# Patient Record
Sex: Male | Born: 1950 | Race: White | Hispanic: No | Marital: Married | Smoking: Never smoker
Health system: Southern US, Community
[De-identification: ages and names within clinical notes are randomized; demographics above are authoritative.]

## PROBLEM LIST (undated history)

## (undated) DIAGNOSIS — M1990 Unspecified osteoarthritis, unspecified site: Secondary | ICD-10-CM

## (undated) DIAGNOSIS — M109 Gout, unspecified: Secondary | ICD-10-CM

## (undated) DIAGNOSIS — Z9884 Bariatric surgery status: Secondary | ICD-10-CM

## (undated) DIAGNOSIS — S82002A Unspecified fracture of left patella, initial encounter for closed fracture: Secondary | ICD-10-CM

## (undated) DIAGNOSIS — S72142A Displaced intertrochanteric fracture of left femur, initial encounter for closed fracture: Secondary | ICD-10-CM

## (undated) HISTORY — PX: KNEE SURGERY: SHX244

---

## 1999-02-07 ENCOUNTER — Encounter: Admission: RE | Admit: 1999-02-07 | Discharge: 1999-05-08 | Payer: Self-pay | Admitting: *Deleted

## 1999-02-24 ENCOUNTER — Ambulatory Visit: Admission: RE | Admit: 1999-02-24 | Discharge: 1999-02-24 | Payer: Self-pay | Admitting: *Deleted

## 1999-02-25 ENCOUNTER — Ambulatory Visit: Admission: RE | Admit: 1999-02-25 | Discharge: 1999-02-25 | Payer: Self-pay | Admitting: *Deleted

## 2002-06-26 ENCOUNTER — Encounter (INDEPENDENT_AMBULATORY_CARE_PROVIDER_SITE_OTHER): Payer: Self-pay

## 2002-06-26 ENCOUNTER — Ambulatory Visit (HOSPITAL_COMMUNITY): Admission: RE | Admit: 2002-06-26 | Discharge: 2002-06-26 | Payer: Self-pay | Admitting: Gastroenterology

## 2002-09-18 ENCOUNTER — Ambulatory Visit (HOSPITAL_BASED_OUTPATIENT_CLINIC_OR_DEPARTMENT_OTHER): Admission: RE | Admit: 2002-09-18 | Discharge: 2002-09-18 | Payer: Self-pay | Admitting: Orthopedic Surgery

## 2003-12-13 ENCOUNTER — Encounter: Admission: RE | Admit: 2003-12-13 | Discharge: 2003-12-13 | Payer: Self-pay | Admitting: Gastroenterology

## 2004-01-27 ENCOUNTER — Encounter (INDEPENDENT_AMBULATORY_CARE_PROVIDER_SITE_OTHER): Payer: Self-pay | Admitting: Specialist

## 2004-01-27 ENCOUNTER — Ambulatory Visit (HOSPITAL_COMMUNITY): Admission: EM | Admit: 2004-01-27 | Discharge: 2004-01-27 | Payer: Self-pay | Admitting: Emergency Medicine

## 2004-03-25 ENCOUNTER — Ambulatory Visit (HOSPITAL_COMMUNITY): Admission: RE | Admit: 2004-03-25 | Discharge: 2004-03-25 | Payer: Self-pay | Admitting: Gastroenterology

## 2004-03-25 ENCOUNTER — Encounter (INDEPENDENT_AMBULATORY_CARE_PROVIDER_SITE_OTHER): Payer: Self-pay | Admitting: *Deleted

## 2004-05-20 ENCOUNTER — Ambulatory Visit (HOSPITAL_BASED_OUTPATIENT_CLINIC_OR_DEPARTMENT_OTHER): Admission: RE | Admit: 2004-05-20 | Discharge: 2004-05-20 | Payer: Self-pay | Admitting: Orthopedic Surgery

## 2004-05-20 ENCOUNTER — Ambulatory Visit (HOSPITAL_COMMUNITY): Admission: RE | Admit: 2004-05-20 | Discharge: 2004-05-20 | Payer: Self-pay | Admitting: Orthopedic Surgery

## 2005-10-09 ENCOUNTER — Encounter: Admission: RE | Admit: 2005-10-09 | Discharge: 2005-10-09 | Payer: Self-pay | Admitting: Surgery

## 2005-10-21 ENCOUNTER — Ambulatory Visit (HOSPITAL_COMMUNITY): Admission: RE | Admit: 2005-10-21 | Discharge: 2005-10-21 | Payer: Self-pay | Admitting: Surgery

## 2005-10-26 ENCOUNTER — Ambulatory Visit (HOSPITAL_COMMUNITY): Admission: RE | Admit: 2005-10-26 | Discharge: 2005-10-26 | Payer: Self-pay | Admitting: Surgery

## 2006-01-04 ENCOUNTER — Encounter: Admission: RE | Admit: 2006-01-04 | Discharge: 2006-01-12 | Payer: Self-pay | Admitting: Surgery

## 2006-01-11 ENCOUNTER — Ambulatory Visit (HOSPITAL_COMMUNITY): Admission: RE | Admit: 2006-01-11 | Discharge: 2006-01-11 | Payer: Self-pay | Admitting: Gastroenterology

## 2006-01-18 ENCOUNTER — Ambulatory Visit (HOSPITAL_COMMUNITY): Admission: RE | Admit: 2006-01-18 | Discharge: 2006-01-19 | Payer: Self-pay | Admitting: Surgery

## 2006-02-05 ENCOUNTER — Encounter: Admission: RE | Admit: 2006-02-05 | Discharge: 2006-02-05 | Payer: Self-pay | Admitting: Rheumatology

## 2006-02-17 ENCOUNTER — Ambulatory Visit (HOSPITAL_COMMUNITY): Admission: RE | Admit: 2006-02-17 | Discharge: 2006-02-17 | Payer: Self-pay | Admitting: Gastroenterology

## 2006-02-17 ENCOUNTER — Encounter (INDEPENDENT_AMBULATORY_CARE_PROVIDER_SITE_OTHER): Payer: Self-pay | Admitting: Specialist

## 2006-06-29 HISTORY — PX: ROUX-EN-Y PROCEDURE: SUR1287

## 2006-08-30 ENCOUNTER — Ambulatory Visit (HOSPITAL_COMMUNITY): Admission: RE | Admit: 2006-08-30 | Discharge: 2006-08-30 | Payer: Self-pay | Admitting: Gastroenterology

## 2007-07-23 ENCOUNTER — Observation Stay (HOSPITAL_COMMUNITY): Admission: EM | Admit: 2007-07-23 | Discharge: 2007-07-24 | Payer: Self-pay | Admitting: Emergency Medicine

## 2007-07-24 ENCOUNTER — Encounter (INDEPENDENT_AMBULATORY_CARE_PROVIDER_SITE_OTHER): Payer: Self-pay | Admitting: Internal Medicine

## 2007-08-01 ENCOUNTER — Encounter: Admission: RE | Admit: 2007-08-01 | Discharge: 2007-08-01 | Payer: Self-pay | Admitting: Family Medicine

## 2007-08-05 ENCOUNTER — Ambulatory Visit (HOSPITAL_COMMUNITY): Admission: RE | Admit: 2007-08-05 | Discharge: 2007-08-05 | Payer: Self-pay | Admitting: Family Medicine

## 2007-08-05 ENCOUNTER — Encounter (INDEPENDENT_AMBULATORY_CARE_PROVIDER_SITE_OTHER): Payer: Self-pay | Admitting: Family Medicine

## 2009-07-18 ENCOUNTER — Encounter: Admission: RE | Admit: 2009-07-18 | Discharge: 2009-07-18 | Payer: Self-pay | Admitting: Rheumatology

## 2009-07-19 ENCOUNTER — Encounter: Admission: RE | Admit: 2009-07-19 | Discharge: 2009-07-19 | Payer: Self-pay | Admitting: Rheumatology

## 2009-07-30 ENCOUNTER — Encounter: Admission: RE | Admit: 2009-07-30 | Discharge: 2009-07-30 | Payer: Self-pay | Admitting: Rheumatology

## 2009-08-22 ENCOUNTER — Encounter: Admission: RE | Admit: 2009-08-22 | Discharge: 2009-08-22 | Payer: Self-pay | Admitting: Rheumatology

## 2010-07-20 ENCOUNTER — Encounter: Payer: Self-pay | Admitting: Gastroenterology

## 2010-07-20 ENCOUNTER — Encounter: Payer: Self-pay | Admitting: Family Medicine

## 2010-11-11 NOTE — Discharge Summary (Signed)
[name], [name]                ACCOUNT NO.:  [id number]   MEDICAL RECORD NO.:  [id number]          PATIENT TYPE:  INP   LOCATION:  4703                         FACILITY:  [facility]   PHYSICIAN:  Corinna L. Lendell Caprice, MDDATE OF BIRTH:  [birth year]   DATE OF ADMISSION:  07/23/2007  DATE OF DISCHARGE:  07/24/2007                               DISCHARGE SUMMARY   DISCHARGE DIAGNOSES:  1. Transient ischemic attack.  2. Abnormal pituitary gland on MRI, needs non-emergent MRI of the      pituitary gland with and without contrast, to evaluate further.  3. Right carotid bruit, needs outpatient carotid Doppler ultrasound.  4. Hyperlipidemia, currently controlled on therapy.  5. Obesity.  6. Obstructive sleep apnea.  7. Arthritis.  8. Gastroesophageal reflux disease.  9. Gout.   DISCHARGE MEDICATIONS:  Aspirin 325 mg a day.  1. He may continue his outpatient medications which include      propoxyphene/APAP, one tablet two to three times a day as needed      for pain.  2. Simvastatin 20 mg a day.  3. Glimepiride/metformin 5/500 mg a day.  4. Colchicine 0.6 mg a day.  5. Cartia XT 120 mg a day.  6. Nexium 40 mg a day.  7. Diclofenac 75 mg a day.   DIET:  Should be diabetic low salt.  He is encouraged activity ad lib.  He is encouraged to increase his aerobic activity and lose weight,  follow up with Dr. Tiburcio Pea, first available appointment.  He will need an  MRI of the pituitary gland with and without contrast and apparently  prefers open MRI.  He will also need an echocardiogram and carotid  Dopplers for workup of the TIA.  Also at this time, his hemoglobin A1c  homocystine level and official reading of the MRI and MRA of the brain  are pending.  Condition stable.   CONSULTATIONS:  None.   PROCEDURES:  None.   PERTINENT LABORATORY DATA:  CBC unremarkable.  Basic metabolic panel  significant for a glucose of 162, total cholesterol is 127,  triglycerides 172, HDL 25, LDL 68.   SPECIAL STUDIES/RADIOLOGY:  EKG shows normal sinus rhythm.  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  The MRA of the brain  was done but has no preliminary reading yet.  CT of the brain without  contrast shows a acute sinusitis with pansinusitis involvement.   HISTORY AND HOSPITAL COURSE:  [name] is a pleasant 60 year old white  male who went to the walk-in clinic after having woken up with left-  sided numbness of his face, left thumb and finger and foot.  He  reportedly had some left-sided weakness and pronator drift in the  clinic.  He also complained of having difficulty drooling when he would  drink liquids.  He was sent to the  emergency room.  He had woken up with  these symptoms.  By the time he was evaluated by me, his only residual  was slightly decreased sensation over the lower jaw area, but otherwise  was neurologically intact.  Please see H&P for complete admission  details.  The patient has no history of stroke.  He was placed on 23-  hour observation on telemetry, where he remained in normal sinus rhythm.  He was started on an aspirin a day.  He had not been on aspirin  previously.  It was a struggle to be even have him consent to be placed  on observation overnight.  As today is Sunday,  doppler of the carotids  and echocardiogram cannot be done.  He refuses to stay for an extra day  so that these can be done on Monday.  He apparently had difficulty in  the MRI scanner due to size and claustrophobia, and the MRI of the  pituitary gland was recommended on a non-emergent basis.  Also, he has  several tests pending as listed above.  His symptoms have completely  resolved  and I feel that he is safe to go home, but I have instructed  him to call 9-1-1 immediately for any recurrence of his symptoms.  As it  is a Sunday, it is difficult to schedule all these follow-up tests as an  outpatient, and I have will therefore asked that his primary care  physician, Dr. Leonides Sake, please do so.  I will leave a message on  Dr. Johnathan Hausen voice mail.  The patient is obese, and I also recommended  portion control and increased aerobic activity.      Corinna L. Lendell Caprice, MD  Electronically Signed     CLS/MEDQ  D:  07/24/2007  T:  07/24/2007  Job:  [id number]   cc:   Holley Bouche, M.D.

## 2010-11-11 NOTE — H&P (Signed)
NAME:  [name], [name]                ACCOUNT NO.:  [id number]   MEDICAL RECORD NO.:  [id number]          PATIENT TYPE:  EMS   LOCATION:  MAJO                         FACILITY:  [facility]   PHYSICIAN:  Corinna L. Lendell Caprice, MDDATE OF BIRTH:  1950/10/31   DATE OF ADMISSION:  07/23/2007  DATE OF DISCHARGE:                              HISTORY & PHYSICAL   CHIEF COMPLAINT:  Tingling.   HISTORY OF PRESENT ILLNESS:  [name] is a 60 year old white male with  multiple medical problems who presents to the emergency room after  having woken up with tingling on the left side of his face.  He also  noted tingling in his left thumb and forefinger.  He also was dribbling  when he was drinking and had to use a straw.  He had some tingling in  his left foot initially, also the left side of his face was numb.  Currently, he has some numbness at the lower left side of his face but  otherwise his symptoms have resolved.  He presented to the [facility] initially and reportedly had some left-sided weakness and  pronator drift.  The patient has no history of stroke.  He is a diabetic  and has hyperlipidemia.  He is obese.   PAST MEDICAL HISTORY:  1. Hyperlipidemia.  2. Diabetes.  3. Obesity.  4. Obstructive sleep apnea.  5. Arthritis.  6. Gastroesophageal reflux disease.   MEDICATIONS:  Metformin, dose unknown.  Nexium daily.  He takes  colchicine one tablet a day and an arthritis medicine, also another  medicine for his esophagus and a cholesterol medicine.  He does not take  aspirin.   FAMILY HISTORY:  His mother had a stroke and died in her late 69s.  His  sister had a stroke during pregnancy.   REVIEW OF SYSTEMS:  Reviewed and as above, otherwise negative.   PHYSICAL EXAMINATION:  Please see nursing notes for vital signs.  GENERAL:  The patient is an obese white male in no acute distress.  HEENT:  Normocephalic, atraumatic.  Pupils equal, round, reactive to  light.  Sclerae  nonicteric.  Moist mucous membranes.  Oropharynx is  without erythema or exudate.  His face is symmetric.  Tongue is midline,  gag reflex intact.  NECK:  Supple.  He has a right-sided carotid bruit.  No lymphadenopathy.  No JVD.  No thyromegaly.  LUNGS:  Clear to auscultation bilaterally without wheezes, rhonchi or  rales.  CARDIOVASCULAR:  Regular rate and rhythm without murmurs, gallops or  rubs.  ABDOMEN:  Obese, soft, nontender.  GU AND RECTAL:  Deferred.  EXTREMITIES:  He has trace edema.  Pulses are intact.  SKIN:  No rash.  PSYCHIATRIC:  The patient has normal affect.  NEUROLOGIC:  Cranial nerve exam is significant for slight diminished  sensation on the left jaw area, otherwise unremarkable.  Motor strength  5/5 in the arms and legs.  Pinprick sensation intact.  Deep tendon  reflexes diminished throughout.  Gait normal.  Romberg negative.  No  pronator drift.  Finger-to-nose normal.  Speech is fluent.  Heel-to-shin  normal.  Babinski's negative.   LABORATORIES:  CBC unremarkable.  Basic metabolic panel significant for  a glucose of 162.  EKG:  Normal sinus rhythm.  CT brain shows no infarct  or bleed.  He has pan sinusitis.   ASSESSMENT AND PLAN:  1. Stroke versus transient ischemic attack:  The patient will be      placed on 23-hour observation.  I will start aspirin daily.  Check      fasting lipids, homocysteine, MRI of brain, MRA of brain and neck,      order carotid Dopplers and echocardiogram, homocysteine, neuro      checks.  He will be placed on telemetry.  2. Diabetes:  He reports taking two tablets of metformin twice a day.      I will give 1000 mg twice a day and monitor blood glucose.  Also      check a hemoglobin A1c.  3. Gastroesophageal reflux disease:  Continue proton pump inhibitor.  4. Obstructive sleep apnea:  Continue CPAP.  5. History of gout:  I failed to mention this above, but I will      continue his colchicine daily.  6. Arthritis.  7.  Obesity.  8. Hyperlipidemia:  See above.      Corinna L. Lendell Caprice, MD  Electronically Signed     CLS/MEDQ  D:  07/23/2007  T:  07/24/2007  Job:  [id number]   cc:   [name], M.D.

## 2010-11-14 NOTE — Op Note (Signed)
[name], [name]                            ACCOUNT NO.:  [id number]   MEDICAL RECORD NO.:  [id number]                   PATIENT TYPE:  AMB   LOCATION:  DSC                                  FACILITY:  [facility]   PHYSICIAN:  Robert A. Thurston Hole, M.D.              DATE OF BIRTH:  [birth year]   DATE OF PROCEDURE:  09/18/2002  DATE OF DISCHARGE:                                 OPERATIVE REPORT   PREOPERATIVE DIAGNOSIS:  Right carpal tunnel syndrome.   POSTOPERATIVE DIAGNOSIS:  Right carpal tunnel syndrome.   PROCEDURE:  Right carpal tunnel release.   SURGEON:  Elana Alm. Thurston Hole, M.D.   ASSISTANT:  Julien Girt, P.A.   ANESTHESIA:  IV regional/general.   OPERATIVE TIME:  20 minutes.   COMPLICATIONS:  None.   INDICATIONS FOR PROCEDURE:  The patient is a 60 year old gentleman who has  had significant painful right carpal tunnel syndrome documented by EMG and  PNC  re-evaluations, and unresponsive to the conservative care.  He is now  to undergo a right carpal tunnel release.   DESCRIPTION OF PROCEDURE:  The patient is brought to the operating room on  September 18, 2002, and placed on the operating room table in the supine  position.  After an adequate level of IV regional anesthesia was obtained,  his right hand and arm were prepped using sterile DuraPrep and draped using  a sterile technique.  On initial skin incision, it was noted that there was  not adequate regional anesthesia, and thus he was converted to general  anesthesia without complications.  Initially through a 4.0 cm palmar  incision, initial exposure was made.  The underlying subcutaneous tissues  were incised in line with the skin incision.  The transverse carpal ligament  was exposed at the level of the reverse flexion crease and incised.  A  hemostat was used to protect the median nerve, and the entire transverse  carpal ligament was released distally to the level of the superficial palmar  arch, carefully  protecting this, and released proximally, approximately  three inches proximal to the wrist flexion crease, carefully protecting the  palmar cutaneous branch of the median nerve.  The median nerve itself was  found to be significantly flattened and compressed, but no other pathology  was noted.  The wound was then irrigated and closed using interrupted #3-0  nylon mattress sutures, and injected with 0.25% Marcaine.  Sterile dressings  were applied.  The tourniquet was released.  The patient was then awakened  and extubated and taken to the recovery room in stable condition.    FOLLOW UP:  The patient will be followed as an outpatient on Vicodin and  Aleve.  I will see him back in the office in one week for a wound check and  followup.  Robert A. Thurston Hole, M.D.    RAW/MEDQ  D:  09/18/2002  T:  09/18/2002  Job:  [id number]

## 2010-11-14 NOTE — Op Note (Signed)
NAMEFABYAN, LOUGHMILLER                ACCOUNT NO.:  [id number]   MEDICAL RECORD NO.:  [id number]          PATIENT TYPE:  AMB   LOCATION:  ENDO                         FACILITY:  [facility]   PHYSICIAN:  Petra Kuba, M.D.    DATE OF BIRTH:  04/01/1951   DATE OF PROCEDURE:  08/30/2006  DATE OF DISCHARGE:                               OPERATIVE REPORT   PROCEDURE:  EGD with Botox injection.   INDICATION:  Dysphasia appears to be achalasia although not confirmed by  manometry, was helped by balloon dilatation in the past.  Consent was  signed after risks, benefits, methods and options were thoroughly  discussed multiple times in the past before any sedation today.   MEDICINES USED:  Fentanyl 75 mcg, Versed 6 mg.   PROCEDURE:  The video endoscope was inserted by direct vision.  The  esophagus was normal.  At the GE junction was the achalasia-like bird's  beak appearance and with minimal gentle pressure we could advance into  the stomach.  On slow withdrawal he did have a small hiatal hernia, but  no obvious stricture or ring.  We did withdraw back to 20-cm which  confirmed the esophageal above-mentioned normal appearance except for  the spasm at the GE junction as mentioned above.  The scope was  reinserted to the antrum where some mild to moderate antral erosions,  probably nonsteroidal-induced were seen.  Advancement showed normal  pylorus into a duodenal bulb where it confirmed mild amount of bulbitis  then around the celiac to a normal second portion of the duodenum.  The  scope was withdrawn back to the bulb which confirmed the above  appearance.  The scope was withdrawn back to the antrum and again the  erosions were confirmed.  The scope was then retroflexed.  High in the  cardia the hiatal hernia was confirmed.  The fundus, angularis, lesser  and greater curve were normal in retroflexed visualization.  Straight  visualization in the stomach did not reveal any additional findings.   We  went ahead and slowly withdrew back to 20-cm, confirming above findings.  We then went ahead and injected Botox into the 4 quadrants at the GE  junction, 1 mL per injection for a total of 4 mL in a 100 units of Botox  in the customary fashion.  There were no signs of bleeding or obvious  complication.  The scope was reinserted into the stomach, air was  suctioned and scope removed.  The patient tolerated the procedure well.  There was no obvious immediate complications.   ENDOSCOPIC DIAGNOSES:  1. Small hiatal hernia with achalasia-like area just above it at the      gastroesophageal junction, status post Botox 100 units in 4      injections in the customary fashion.  2. Multiple antral erosions, mild to moderate.  3. Minimal bulbitis.  4. Otherwise normal esophagogastroduodenoscopy.   PLAN:  See how the Botox works.  Call me p.r.n.  Otherwise, follow up in  6 weeks to recheck symptoms and decide further workup and plans.  In the  meantime,  will add a pump inhibitor over-the-counter Prilosec to protect  the stomach from further aspirin and nonsteroidal damage.           ______________________________  Petra Kuba, M.D.     MEM/MEDQ  D:  08/30/2006  T:  08/30/2006  Job:  [id number]   cc:   Holley Bouche, M.D.  [place] Daphine Deutscher, MD

## 2010-11-14 NOTE — Op Note (Signed)
[name], [name]                ACCOUNT NO.:  [id number]   MEDICAL RECORD NO.:  [id number]          PATIENT TYPE:  AMB   LOCATION:  ENDO                         FACILITY:  [facility]   PHYSICIAN:  Petra Kuba, M.D.    DATE OF BIRTH:  [birth year]   DATE OF PROCEDURE:  03/25/2004  DATE OF DISCHARGE:                                 OPERATIVE REPORT   PROCEDURE:  Esophagogastroduodenoscopy with biopsy and Savory dilatation.   INDICATIONS:  Dysphagia and want to recheck his esophageal ulcer.   Consent was signed after risks, benefits, methods, and options thoroughly  discussed in the office.   MEDICINES USED:  Demerol 40, Versed 4.   PROCEDURE NOTE:  A video endoscope was inserted by direct vision. The  proximal and mid esophagus were normal, and distal esophagus was a very  small swallow linear ulceration. He had a large hiatal hernia. There seemed  to be a widely patent fibrous ring, although there was a lot of distal  esophageal spasm. Scope passed easily into the moderately large hiatal  hernia pouch, into the stomach, advanced to the antrum where some antral  erosions were seen, and through a normal pylorus into a normal duodenal bulb  _____________ to a  normal second portion of the duodenum. Scope was  withdrawn back to the bulb, and a good look there ruled out ulcers in that  location. Scope was withdrawn back to the stomach and retroflexed.  Angularis, cardiac, and fundus were pertinent for the hiatal hernia being  confirmed. No other abnormalities. Lesser and greater curve were normal.  Straight visualization of the stomach did not reveal any additional  findings. Scope was slowly withdrawn back to about 20 cm which confirmed the  above esophageal findings. Scope was again slowly advanced to the antrum.  Few biopsies of the antrum and few of the proximal stomach were obtained to  rule out helicobacter pylori. Scope was reinserted to the antrum. Savory  wire was advanced and  confirmed in the proper position under fluoro. Scope  was removed, making sure to keep the wire in proper position, and in  succession, the Savory 14, 16, and 17 mm dilators were all advanced into the  stomach and turned in the proper position under fluoro. There was no heme or  no resistance on any of the dilators. Once the 17 was confirmed in the  stomach, the wire was withdrawn back into the scope. Both were removed in  tandem. The procedure was terminated. The patient tolerated the procedure  well. There was no obvious immediate complication.   ENDOSCOPIC DIAGNOSES:  1.  Large hiatal hernia with moderately wide patent ring but there was some      spasms.  2.  Swallow linear esophageal ulcer, not worrisome.  3.  Multiple antral erosions status post biopsy.  4.  Otherwise normal esophagogastroduodenoscopy therapy. Savory dilatation      under fluoro to 17 mm without heme or resistance.   PLAN:  See if the dilation works. Consider adding Zelnorm or Reglan. Might  want to repeat his barium swallow, possibly even  a manometry if achalasia is  suspected. Possible change pump inhibitors. Followup p.r.n. or in two  months. Recheck symptoms and make sure no further work up plans are needed.      MEM/MEDQ  D:  03/25/2004  T:  03/25/2004  Job:  [id number]

## 2010-11-14 NOTE — Op Note (Signed)
NAMEAVRAM, DANIELSON                ACCOUNT NO.:  [id number]   MEDICAL RECORD NO.:  [id number]          PATIENT TYPE:  AMB   LOCATION:  DSC                          FACILITY:  [facility]   PHYSICIAN:  Robert A. Thurston Hole, M.D. DATE OF BIRTH:  12-May-1951   DATE OF PROCEDURE:  05/20/2004  DATE OF DISCHARGE:                                 OPERATIVE REPORT   PREOPERATIVE DIAGNOSIS:  1.  Right shoulder rotator cuff tear.  2.  Right shoulder partial labral tear.  3.  Right shoulder impingement.  4.  Right shoulder acromioclavicular spurring and degenerative joint      disease.  5.  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   POSTOPERATIVE DIAGNOSIS:  1.  Right shoulder rotator cuff tear.  2.  Right shoulder partial labral tear.  3.  Right shoulder impingement.  4.  Right shoulder acromioclavicular spurring and degenerative joint      disease.  5.  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   PROCEDURE:  1.  Right shoulder examination under anesthesia followed by arthroscopic      partial labral tear debridement.  2.  Right shoulder arthroscopically assisted rotator cuff repair using      Arthrex suture anchors x 2.  3.  Right shoulder subacromial decompression.  4.  Right shoulder distal clavicle excision.  5.  Bilateral knee cortisone injections.   SURGEON:  Elana Alm. Thurston Hole, M.D.   ASSISTANT:  Julien Girt, P.A.   ANESTHESIA:  General.   OPERATIVE TIME:  1 hour 15 minutes.   COMPLICATIONS:  None.   INDICATIONS FOR PROCEDURE:  Mr. Golden is a 60 year old gentleman who  injured his right shoulder in a fall approximately six weeks ago.  Exam, x-  ray, and MRI has revealed a rotator cuff tear with a possible partial labrum  tear with impingement and AC joint spurring.  He has failed conservative  care and is now to undergo arthroscopy and repair.  Also of note, he has had  significant bilateral knee pain with DJD.  He has been off his anti-  inflammatories because of this upcoming surgery and is having increasing  pain and I discussed this with him prior to the surgery and he requested, if  possible, to proceed with knee injections which I told him would not be  contraindicated and thus will elect to do this.   DESCRIPTION OF PROCEDURE:  Mr. Schleicher was brought to the operating room on  May 20, 2004, after an interscalene block was placed in the holding  room by anesthesia.  He was placed on the operating table in supine  position.  After being placed under general anesthesia, his right shoulder  was examined.  He had full range of motion and his shoulder was stable  ligamentous exam.  Both knees were examined.  Range of motion 0 to 125  degrees bilaterally.  The knees were stable ligamentous exam.  Each knee was  injected sterilely with 40 mg Depo-Medrol and 3 mL of Marcaine.  After this  was done, the knee was placed in the beach chair position  and the shoulder  and arm was prepped using sterile DuraPrep and draped using sterile  technique.  He received Ancef 1 gram IV preoperatively for prophylaxis.  Initially, the arthroscopy was performed through a posterior arthroscopic  portal.  The arthroscope with a pump attached was placed and through an  anterior portal, an arthroscopic probe was placed.  On initial inspection,  the articular cartilage in the glenohumeral joint was intact.  The anterior  labrum showed partial tearing, 25-30%, in the mid portion down to about the  5 o'clock position.  This was almost a partial Bankart but it was not  detached all the way just partially and the anterior inferior glenohumeral  complex remained intact.  This was partially debrided but there was still a  very good and solid attachment.  The superior labrum and biceps tendon  anchor was intact.  The biceps tendon was intact.  The posterior labrum was  intact.  The rotator cuff showed a complete tear of the supraspinatus and a  partial  tear of the infraspinatus and this was partially debrided  arthroscopically.  The rest of the rotator cuff was intact.  The inferior  capsular recess was free of pathology.  The subacromial space was entered  and a lateral arthroscopic portal was made.  Moderately thickened bursitis  was resected.  Impingement was noted and a subacromial decompression was  carried out removing 6-8 mm of the under surface of the anterior,  anterolateral, and anteromedial acromion and CA ligament release carried  out, as well.  The [facility] joint showed significant spurring and degenerative  changes and the distal clavicle was excised with 5-6 mm being excised with a  6 mm bur.  After this was done, then through an accessory portal, two  separate Arthrex suture anchors were placed in the greater tuberosity, each  of these were 5.5 mm in diameter and each of these then had the sutures  passed arthroscopically through the tears in the rotator cuff and then tied  down arthroscopically sequentially, thus repairing and securing the rotator  cuff tear back down to the greater tuberosity.  After this was done, the  shoulder could be brought through a full range of motion with no impingement  on the repair.  At this point, it was felt that a firm, tight, and  satisfactory repair was achieved and all the pathology had been  satisfactorily removed.  The instruments were removed.  The portals were  closed with 3-0 nylon sutures.  Sterile dressings and a sling were applied.  The patient was awakened and taken to the recovery room in stable condition.   FOLLOW UP CARE:  Mr. Shetley will be followed as an overnight recovery care  patient for IV pain control and neurovascular monitoring.  He will be  discharged tomorrow on Percocet and Naprosyn.  See him back in the office in  a week for sutures out and follow up.       RAW/MEDQ  D:  05/20/2004  T:  05/20/2004  Job:  [id number]

## 2010-11-14 NOTE — Op Note (Signed)
NAME:  [name], [name]                ACCOUNT NO.:  [id number]   MEDICAL RECORD NO.:  [id number]          PATIENT TYPE:  AMB   LOCATION:  ENDO                         FACILITY:  [facility]   PHYSICIAN:  Petra Kuba, M.D.    DATE OF BIRTH:  04-18-1951   DATE OF PROCEDURE:  DATE OF DISCHARGE:                                 OPERATIVE REPORT   PROCEDURE:  EGD with balloon dilatation and biopsy.   INDICATION:  Patient with dysphagia, nausea and vomiting.  Endoscopically  and a barium swallow questions about achalasia.  Manometry did not confirm  it.  Want to reevaluate.  Consent was signed after risks, benefits, methods  and options were thoroughly discussed multiple times in the past.   MEDICINES USED:  Fentanyl 100 mcg, Versed 7 mg.   PROCEDURE:  The video endoscope was inserted by direct vision.  He did have  some mild esophagitis which was biopsied at the end of the procedure.  The  scope passed to the distal esophagus and there was some increased spasm.  It  did have an achalasia appearance.  With gentle pressure, we were able to  advance into a moderate-sized hiatal hernia pouch.  There seemed to be some  trauma on advancing it, which felt like the pumping of a ring, and on slow  withdrawal back through the hiatal hernia in the esophagus, it did seem that  he had a fibrous ring as well.  We were more easily able to advance in and  out of the distal esophagus, but still a moderate amount of spasm persisted.  The scope was advanced into the antrum pertinent for some mild antritis  through a normal pylorus into a normal duodenal bulb and around the C-loop  to a normal second portion of the duodenum.  Scope was withdrawn back to the  stomach and retroflexed.  Angularis and fundus were normal.  The lesser and  greater curve had some gastritis high in the cardia and the hiatal hernia  was confirmed.  Straight visualization of the stomach did not reveal any  additional findings.  At the end  of the procedure, a few biopsies of the  antrum and a few of the proximal stomach were obtained to rule out  Helicobacter.  The scope was then slowly withdrawn back through the  esophagus which confirmed the above findings.  We went ahead and decided to  do balloon dilatation.  The scope was advanced into the stomach.  The 8 cm  balloons were advanced, first the adjustable 12-15, and once the balloon  catheter was advanced into the stomach, the scope and the balloon were  withdrawn back to the distal esophagus.  The balloon was inflated in the  customary fashion, first to 12 then 13.5 then 15.  All were done with 15  seconds on the first two and 45 seconds on the second.  There was a minimal  trauma, no active bleeding after the 15 was deflated, and we elected to  advance the 15-18 adjustable balloons which were 8 cm as well, and in the  same  fashion as above, the 18 mm balloon was inflated across the GE junction  x2, being held for 45 seconds the first time and then a minute fo the second  time.  There was some increased spasm and a few times we did have to  reposition the balloon by deflating it and starting it again.  After the  balloon dilatations, the biopsies were obtained as above.  The air was  suctioned and the scope removed, with no signs of obvious trauma, although  there was still some distal esophageal spasm.  We were able to advance in  and out of the stomach much more readily.  The scope was removed.  The  patient tolerated the procedure well.  There was no obvious immediate  complication.   ENDOSCOPIC DIAGNOSES:  1. Increased esophageal spasm.  Does look like achalasia.  2. Moderate hiatal hernia with, I believe, a small fibrous ring just below      the spasm, status post dilation using the balloon dilators as above, 8      cm to 18 mm.  3. Mild gastritis status post biopsy.  4. Mild esophagitis status post biopsy.  5. Otherwise normal esophagogastroduodenoscopy.    PLAN:  Await pathology and see how dilation works.  Follow up with me in 1-2  months.  Consider empiric Botox just to see if that works even better versus  a repeat manometry to try to prove better achalasia and call me sooner  p.r.n.           ______________________________  Petra Kuba, M.D.     MEM/MEDQ  D:  02/17/2006  T:  02/17/2006  Job:  [id number]

## 2010-11-14 NOTE — Op Note (Signed)
[name], [name]                ACCOUNT NO.:  [id number]   MEDICAL RECORD NO.:  [id number]          PATIENT TYPE:  OIB   LOCATION:  1319                         FACILITY:  [facility]   PHYSICIAN:  Thornton Park. Daphine Deutscher, MD  DATE OF BIRTH:  Jan 21, 1951   DATE OF PROCEDURE:  01/18/2006  DATE OF DISCHARGE:                                 OPERATIVE REPORT   PREOPERATIVE DIAGNOSIS:  Morbid obesity with multiple comorbidities  including diabetes mellitus, sleep apnea and arthritis.   POSTOPERATIVE DIAGNOSIS:  Morbid obesity with multiple comorbidities  including diabetes mellitus, sleep apnea and arthritis, with evidence of  esophageal motility disorder, possible early achalasia.   PROCEDURE:  Laparoscopy with endoscopy intraoperative.  No band was placed  secondary to the feeling that this is likely a more of a motility  disturbance, possible achalasia, and felt that a band would not be advisable  under these circumstances.   DESCRIPTION OF PROCEDURE:  [name] was taken to room one, on January 18, 2006, and given general anesthesia.  Abdomen was prepped widely with  chlorhexidine and draped sterilely.  I entered the abdomen through his left  upper quadrant, with the OptiVu technique, without difficulty and  insufflated him.  I then placed a requisite number of trocars, placing them  a little higher because he had a very large upper abdomen.  I began  dissection in the foregut, after putting a Nathanson retractor beneath his  liver, and did encounter some bleeding there in the gastrohepatic ligament,  requiring some clips in a branch.  I went ahead and controlled that, and  then moved on up to visualize the right crus.  I took the reflection of the  phrenoesophageal ligament anteriorly, and then I went posteriorly and  dissected a very fatty filled hiatus.  There was evidence of inflammatory  change around the distal esophagus.  This had, on the external, appearance  of significant reflux.   He did have a marked fattiness to this foregut with  evidence of a hiatal hernia that had been maybe sliding in this area.  However, I did then pass an upper endoscope to define the anatomy.   I passed the upper endoscope without difficulty, and upon entering the  esophagus, I found it to be a dilated tube with some nonperistaltic  contractions.  The most striking finding, however, was in the distal  esophagus where the esophagus was quite patulous and looked to have some  material with evidence of stasis and the esophagogastric junction just did  not relax.  It remained quite tight throughout.  In the absence of  relaxation, I was able to try to drive-through it and look into the stomach  and then decompressed that, and then pulled back to try to correlate his  esophageal anatomy along with a laparoscopic anatomy.  I felt that the EG  junction was within the abdomen at that point.  However, because in looking  at his upper GI, which had somewhat of a bird's beak appearance and evidence  of poor esophageal motility, that and my endoscopic findings tended to  contradict the manometry findings.  I felt that it would be best not to put  a band in this situation as it would likely compound an already potentially  impending achalasia.  I, therefore, irrigated and observed the upper  abdomen.  I had endoscoped and there was no evidence of any perforation.  The endoscope was withdrawn.  The gas was taken off, and the wounds were  closed 4-0 Vicryl, Benzoin and Steri-Strips.  Operative time was about 2  hours.   SURGEON:  Dr. Daphine Deutscher.   ASSISTANT:  Dr. Colin Benton.   ESTIMATED BLOOD LOSS:  About 150 mL.   The patient was taken to recovery room in satisfactory condition and will be  kept overnight.      Thornton Park Daphine Deutscher, MD  Electronically Signed     MBM/MEDQ  D:  01/18/2006  T:  01/18/2006  Job:  [id number]   cc:   Holley Bouche, M.D.  Fax: [phone]   Petra Kuba, M.D.  Fax:  [phone]

## 2010-11-14 NOTE — Consult Note (Signed)
NAME:  [name], [name]                          ACCOUNT NO.:  [id number]   MEDICAL RECORD NO.:  [id number]                   PATIENT TYPE:  EMS   LOCATION:  ED                                   [facility]ACILITY[facility]  [facility]   PHYSICIAN:  Graylin Shiver, M.D.                DATE OF BIRTH:  [birth year]   DATE OF CONSULTATION:  DATE OF DISCHARGE:                                   CONSULTATION   HISTORY OF PRESENT ILLNESS:  [name] is a 60 year old male who has  recently seen Dr. Ewing Schlein in the office with complaints of some dysphagia.  The patient recently had an upper GI series done with swallowing of a barium  tablet.  This barium tablet lodged temporarily in the region of the  esophageal gastric junction but then passed spontaneously.  No obvious  stricture was seen.  [name] paged the GI doctor on call this morning,  which was me, and was complaining of difficulty keeping any food or liquid  down for the last 36 hours.  On Friday evening, he was eating some barbecued  chicken and after eating this barbecued chicken was unable to keep any food  or liquid down.  He would immediately throw things back up.  He was also  unable to keep his medications down.  He had been on Nexium.  Because of the  story that the patient gave me, I thought that he might have a foreign body  in the esophagus and asked him to go to the emergency room where I saw him.   PAST MEDICAL HISTORY:  Medical problems:  Non-insulin-dependent diabetes  mellitus, history of sleep apnea.   ALLERGIES:  None known.   CURRENT MEDICATIONS:  Glucophage, Nexium.  He also takes Motrin according to  his wife.   REVIEW OF SYSTEMS:  He denied anginal chest pain.  He denied shortness of  breath.  He denied fever.  He complained of severe, intense heart burn.  He  also had reported that today he was bringing up a little bit of blackish-  appearing material when he would vomit.   PHYSICAL EXAMINATION:  He did not appear in acute distress.   He was  nonicteric.  Pulse 102.  Blood pressure 153/96.  HEART:  Regular rhythm, no murmurs.  LUNGS:  Clear.  ABDOMEN:  Soft, nontender, no hepatosplenomegaly.   IMPRESSION:  Suspected foreign body in the esophagus.   PLAN:  I will proceed with EGD at this time to further evaluate.  Procedure  was explained along with the potential risks of bleeding, injection and  perforation.  He understands and consents to procedure.  Graylin Shiver, M.D.    Germain Osgood  D:  01/27/2004  T:  01/27/2004  Job:  [id number]   cc:   Petra Kuba, M.D.  [address], [address]  [place]  [place] [address]  Fax: [phone]   Fulton Mole, M.D.

## 2010-11-14 NOTE — Op Note (Signed)
[name], [name]                ACCOUNT NO.:  [id number]   MEDICAL RECORD NO.:  [id number]          PATIENT TYPE:  AMB   LOCATION:  ENDO                         FACILITY:  [facility]   PHYSICIAN:  Petra Kuba, M.D.    DATE OF BIRTH:  1950-07-05   DATE OF PROCEDURE:  01/11/2006  DATE OF DISCHARGE:  01/11/2006                                 OPERATIVE REPORT   HISTORY:  The patient with a hiatal hernia.  Dr. Daphine Deutscher requesting manometry  prior to surgical lap band to consider fixing hiatal hernia as well.  The  manometry was performed by the endoscopy nurse and the computer generated  reported submitted to me for my interpretation.   ASSESSMENT:  1.  Upper esophageal sphincter slight decreased pressure, normal residual,      normal duration, 98% relaxation with normal pharyngeal pressures.  2.  Esophageal body.      1.  Upper:  Normal amplitude, slight short duration.      2.  Lower:  Normal amplitude in the middle range, slight increased          pressures, and slight short duration proximally.  Mid also had a          slight long duration, with normal velocity, slight decreased          peristalsis with 73% proper peristalsis versus 80 being normal with          one simultaneous, one retrograde, and one non-transmitted.  3.  Lower esophageal sphincter, normal pressure, normal residual pressures,      100% relaxation.   IMPRESSION:  Slight mild abnormality, but probably okay overall.  Not as bad  as the upper GI would suggest.   PLAN:  I have discussed the surgical options with Dr. Daphine Deutscher.  He can adjust  the tightness of the wrap based on physical findings and the band procedure,  but probably okay for a wrap.  I am happy to assist p.r.n.           ______________________________  Petra Kuba, M.D.     MEM/MEDQ  D:  01/14/2006  T:  01/15/2006  Job:  [id number]   cc:   Thornton Park Daphine Deutscher, MD  [address], [address]  [place]  [place] [address]

## 2010-11-14 NOTE — Op Note (Signed)
NAME:  [name], [name]                          ACCOUNT NO.:  [id number]   MEDICAL RECORD NO.:  [id number]                   PATIENT TYPE:  EMS   LOCATION:  ED                                   FACILITY:  [facility]   PHYSICIAN:  Graylin Shiver, M.D.                DATE OF BIRTH:  Oct 15, 1950   DATE OF PROCEDURE:  01/27/2004  DATE OF DISCHARGE:                                 OPERATIVE REPORT   PROCEDURE:  Upper gastrointestinal endoscopy.   INDICATION FOR PROCEDURE:  Suspected esophageal foreign body.  The patient  has been having difficulty swallowing and was unable to keep much of  anything down otherwise vomiting.  He also had experienced a few coffee-  ground appearing flecks in the emesis.   Informed consent was obtained after explanation of the risks of bleeding,  infection, and perforation.   PREMEDICATIONS:  1. Fentanyl 75 mcg IV.  2. Versed 6 mg IV.   DESCRIPTION OF PROCEDURE:  With the patient in the left lateral decubitus  position, the Olympus gastroscope was inserted into the oropharynx and  passed into the esophagus.  It was advanced down the esophagus to the level  of the esophagogastric junction.  There was no evidence of a foreign body;  however, there was a large, deep-appearing ulcer at the level of the  esophagogastric junction.  The scope was advanced into the stomach and to  the distal stomach to the region of the pylorus.  The pylorus was spastic,  and the patient was coughing and gagging quite a bit, and I did not pursue  intubation of the pylorus because the patient was coughing and gagging so  much.  The antrum looked normal.  The body of the stomach looked normal.  The scope was retroflexed, and the fundus and cardia were inspected.  There  was a moderate-sized hiatal hernia.  I could also see the ulcer on  retroflexed view.  The area also looked quite edematous.  I straightened the  scope and brought it back to the ulcer area.  I obtained several biopsies  from the ulcerated area for histological inspection.  The rest of the  esophagus looked normal.  Of note is that the ulcer was located at 38 cm  from the incisor teeth.  He tolerated the procedure well without obvious  complications.   IMPRESSION:  Large ulcer at the level of the esophagogastric junction.  Biopsies were obtained.  No evidence of a foreign body at this time.   PLAN:  I will give the patient a prescription for Phenergan suppositories 25  mg 1 b.i.d.  I will also ask him to take Nexium 40 mg p.o. b.i.d. after he  uses the suppositories.  I will have him call the office tomorrow to report  to [place] and let us know if he is able to keep his medications down now or  clear liquids  down with the use of the Phenergan suppositories.  If he is  unable to keep medications down or liquids down, I suspect that he will need  to be admitted to the hospital for intravenous therapy primarily of a proton  pump inhibitor and antiemetic.  Additionally, the biopsies obtained from the  ulcer will be checked.  I am not sure if this ulcer was secondary to a  foreign body having been stuck there for quite some time or whether it might  be that a Motrin pill was stuck there or whether this could be even a  malignancy.  Certainly, follow-up will be necessary with repeat endoscopy in  the future if the biopsies  turn out to be benign.  If the biopsies do turn out to be benign, I would  recommend treating this ulcer and if symptoms improve, I would also  recommend re-endoscoping him to reevaluate the area to make sure there is  not an underlying hidden malignancy in this region.                                               Graylin Shiver, M.D.    Germain Osgood  D:  01/27/2004  T:  01/27/2004  Job:  [id number]   cc:   Petra Kuba, M.D.  [address]  [place]  [place] [address]  Fax: [phone]   Elana Alm. Nicholos Johns, M.D.  [address]  [place]  [place] [address]  Fax: [phone]

## 2011-01-26 ENCOUNTER — Encounter: Payer: Self-pay | Admitting: Podiatrist

## 2011-02-10 ENCOUNTER — Other Ambulatory Visit: Payer: Self-pay | Admitting: Rheumatology

## 2011-02-10 DIAGNOSIS — M48061 Spinal stenosis, lumbar region without neurogenic claudication: Secondary | ICD-10-CM

## 2011-02-10 DIAGNOSIS — M5416 Radiculopathy, lumbar region: Secondary | ICD-10-CM

## 2011-02-11 ENCOUNTER — Other Ambulatory Visit: Payer: Self-pay

## 2011-02-11 ENCOUNTER — Ambulatory Visit
Admission: RE | Admit: 2011-02-11 | Discharge: 2011-02-11 | Disposition: A | Discharge status: Home / Self Care | Payer: BC Managed Care – PPO | Source: Ambulatory Visit | Attending: Rheumatology | Admitting: Rheumatology

## 2011-02-11 DIAGNOSIS — M5416 Radiculopathy, lumbar region: Secondary | ICD-10-CM

## 2011-02-11 DIAGNOSIS — M48061 Spinal stenosis, lumbar region without neurogenic claudication: Secondary | ICD-10-CM

## 2011-02-11 MED ORDER — METHYLPREDNISOLONE ACETATE 40 MG/ML INJ SUSP (RADIOLOG
120.0000 mg | Freq: Once | INTRAMUSCULAR | Status: AC
  Administered 2011-02-11: 120 mg via EPIDURAL

## 2011-02-11 MED ORDER — IOHEXOL 180 MG/ML  SOLN
1.0000 mL | Freq: Once | INTRAMUSCULAR | Status: AC | PRN
  Administered 2011-02-11: 1 mL via EPIDURAL

## 2011-03-19 LAB — POCT I-STAT CREATININE
Creatinine, Ser: 0.8
Operator id: 196461

## 2011-03-19 LAB — CBC
Hemoglobin: 14
Platelets: 218
RDW: 12.7
WBC: 7.8

## 2011-03-19 LAB — LIPID PANEL
HDL: 25 — ABNORMAL LOW
VLDL: 34

## 2011-03-19 LAB — DIFFERENTIAL
Basophils Absolute: 0
Basophils Relative: 0
Neutro Abs: 3.8
Neutrophils Relative %: 48

## 2011-03-19 LAB — I-STAT 8, (EC8 V) (CONVERTED LAB)
Bicarbonate: 26.5 — ABNORMAL HIGH
HCT: 42
Potassium: 4.4
Sodium: 139
TCO2: 28

## 2011-03-19 LAB — HEMOGLOBIN A1C: Hgb A1c MFr Bld: 6.9 — ABNORMAL HIGH

## 2011-03-23 ENCOUNTER — Other Ambulatory Visit: Payer: Self-pay | Admitting: Rheumatology

## 2011-03-23 DIAGNOSIS — M5416 Radiculopathy, lumbar region: Secondary | ICD-10-CM

## 2011-03-25 ENCOUNTER — Ambulatory Visit
Admission: RE | Admit: 2011-03-25 | Discharge: 2011-03-25 | Disposition: A | Discharge status: Home / Self Care | Payer: BC Managed Care – PPO | Source: Ambulatory Visit | Attending: Rheumatology | Admitting: Rheumatology

## 2011-03-25 DIAGNOSIS — M5416 Radiculopathy, lumbar region: Secondary | ICD-10-CM

## 2011-03-25 MED ORDER — METHYLPREDNISOLONE ACETATE 40 MG/ML INJ SUSP (RADIOLOG
120.0000 mg | Freq: Once | INTRAMUSCULAR | Status: AC
  Administered 2011-03-25: 120 mg via EPIDURAL

## 2011-03-25 MED ORDER — IOHEXOL 180 MG/ML  SOLN
1.0000 mL | Freq: Once | INTRAMUSCULAR | Status: AC | PRN
  Administered 2011-03-25: 1 mL via EPIDURAL

## 2011-07-19 ENCOUNTER — Inpatient Hospital Stay (HOSPITAL_COMMUNITY): Payer: BC Managed Care – PPO

## 2011-07-19 ENCOUNTER — Encounter (HOSPITAL_COMMUNITY): Payer: Self-pay | Admitting: Orthopedic Surgery

## 2011-07-19 ENCOUNTER — Other Ambulatory Visit: Payer: Self-pay | Admitting: Orthopedic Surgery

## 2011-07-19 ENCOUNTER — Inpatient Hospital Stay (HOSPITAL_COMMUNITY)
Admission: AD | Admit: 2011-07-19 | Discharge: 2011-07-25 | DRG: 211 | Disposition: A | Discharge status: Home Health | Payer: BC Managed Care – PPO | Source: Ambulatory Visit | Attending: Orthopedic Surgery | Admitting: Orthopedic Surgery

## 2011-07-19 DIAGNOSIS — Z9884 Bariatric surgery status: Secondary | ICD-10-CM

## 2011-07-19 DIAGNOSIS — W19XXXA Unspecified fall, initial encounter: Secondary | ICD-10-CM | POA: Diagnosis present

## 2011-07-19 DIAGNOSIS — S72143A Displaced intertrochanteric fracture of unspecified femur, initial encounter for closed fracture: Principal | ICD-10-CM | POA: Diagnosis present

## 2011-07-19 DIAGNOSIS — S82002A Unspecified fracture of left patella, initial encounter for closed fracture: Secondary | ICD-10-CM | POA: Insufficient documentation

## 2011-07-19 DIAGNOSIS — S72142A Displaced intertrochanteric fracture of left femur, initial encounter for closed fracture: Secondary | ICD-10-CM | POA: Diagnosis present

## 2011-07-19 DIAGNOSIS — M1710 Unilateral primary osteoarthritis, unspecified knee: Secondary | ICD-10-CM | POA: Diagnosis present

## 2011-07-19 HISTORY — DX: Unspecified fracture of left patella, initial encounter for closed fracture: S82.002A

## 2011-07-19 HISTORY — DX: Unspecified osteoarthritis, unspecified site: M19.90

## 2011-07-19 HISTORY — DX: Displaced intertrochanteric fracture of left femur, initial encounter for closed fracture: S72.142A

## 2011-07-19 HISTORY — DX: Bariatric surgery status: Z98.84

## 2011-07-19 LAB — COMPREHENSIVE METABOLIC PANEL
AST: 14 U/L (ref 0–37)
Albumin: 2.8 g/dL — ABNORMAL LOW (ref 3.5–5.2)
Calcium: 8.6 mg/dL (ref 8.4–10.5)
Creatinine, Ser: 0.51 mg/dL (ref 0.50–1.35)
Total Protein: 5.7 g/dL — ABNORMAL LOW (ref 6.0–8.3)

## 2011-07-19 LAB — CBC
MCH: 29.2 pg (ref 26.0–34.0)
MCV: 89.5 fL (ref 78.0–100.0)
Platelets: 214 10*3/uL (ref 150–400)
RDW: 14 % (ref 11.5–15.5)

## 2011-07-19 LAB — PROTIME-INR: INR: 1.01 (ref 0.00–1.49)

## 2011-07-19 MED ORDER — OXYCODONE-ACETAMINOPHEN 5-325 MG PO TABS
1.0000 | ORAL_TABLET | ORAL | Status: DC | PRN
Start: 2011-07-19 — End: 2011-07-22
  Administered 2011-07-19 – 2011-07-21 (×6): 2 via ORAL
  Filled 2011-07-19 (×6): qty 2

## 2011-07-19 MED ORDER — DOCUSATE SODIUM 100 MG PO CAPS
100.0000 mg | ORAL_CAPSULE | Freq: Two times a day (BID) | ORAL | Status: DC
  Administered 2011-07-19 – 2011-07-21 (×5): 100 mg via ORAL
  Filled 2011-07-19 (×4): qty 1

## 2011-07-19 MED ORDER — POTASSIUM CHLORIDE IN NACL 20-0.45 MEQ/L-% IV SOLN
INTRAVENOUS | Status: DC
  Administered 2011-07-19: 23:00:00 via INTRAVENOUS
  Filled 2011-07-19 (×4): qty 1000

## 2011-07-19 MED ORDER — HYDROMORPHONE HCL PF 1 MG/ML IJ SOLN
1.0000 mg | INTRAMUSCULAR | Status: DC | PRN
  Administered 2011-07-19: 1 mg via INTRAVENOUS
  Filled 2011-07-19: qty 1

## 2011-07-19 MED ORDER — SENNOSIDES-DOCUSATE SODIUM 8.6-50 MG PO TABS: 1.0000 | ORAL_TABLET | Freq: Every evening | ORAL | Status: DC | PRN

## 2011-07-19 MED ORDER — METHOCARBAMOL 100 MG/ML IJ SOLN
500.0000 mg | Freq: Four times a day (QID) | INTRAVENOUS | Status: DC | PRN
  Administered 2011-07-19 – 2011-07-20 (×2): 500 mg via INTRAVENOUS
  Filled 2011-07-19 (×2): qty 5

## 2011-07-19 MED ORDER — METHOCARBAMOL 500 MG PO TABS
500.0000 mg | ORAL_TABLET | Freq: Four times a day (QID) | ORAL | Status: DC | PRN
  Administered 2011-07-20 – 2011-07-21 (×2): 500 mg via ORAL
  Filled 2011-07-19 (×2): qty 1

## 2011-07-19 MED ORDER — GABAPENTIN 300 MG PO CAPS
600.0000 mg | ORAL_CAPSULE | Freq: Two times a day (BID) | ORAL | Status: DC
  Administered 2011-07-19 – 2011-07-21 (×5): 600 mg via ORAL
  Filled 2011-07-19 (×5): qty 2

## 2011-07-19 NOTE — H&P (Signed)
ADMISSION H&P  Chief Complaint: Left hip pain  HPI: [name] is a 61 y.o. male who had a mechanical fall over a carpet one week ago. He had acute severe pain over his left hip, but was able to function. He was seen at our office by Dr. Farris Has, and x-rays were taken at that time of his left hip, that apparently were negative. He was then having increasing pain, and was seen at orthopedic urgent care, and followup x-rays demonstrated what sounds like a greater trochanteric hip fracture. He had increasing pain and was unable to walk and pain was not controlled with hydrocodone, and so I received a call on call this evening regarding his deteriorating condition. He was unable to walk, unable to control his pain, and required substantial assistance. I instructed him to come in by ambulance, and he was directly admitted to the orthopedic floor.  He does have a past history of a patella fracture that was treated nonoperatively, which occurred this past fall. 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  Past Medical History  Diagnosis Date  . 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 Left patella fracture 07/19/2011   No past surgical history on file. History   Social History  . Marital Status: Married    Spouse Name: N/A    Number of Children: N/A  . Years of Education: N/A   Occupational History  . 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 Smoking status: Never Smoker   . Smokeless tobacco: Not on file  . Alcohol Use: Not on file  . Drug Use: Not on file  . Sexually Active: Not on file   Other Topics Concern  . Not on file   Social History Narrative  . No narrative on file   Family History  Problem Relation Age of Onset  . Heart disease Father     Died in his 94s of a heart attack   No Known Allergies Prior to Admission  medications   Not on File     Positive ROS: All other systems have been reviewed and were otherwise negative with the exception of those mentioned in the HPI and as above.  Physical Exam: General: Alert, no acute distress Cardiovascular: No pedal edema Respiratory: No cyanosis, no use of accessory musculature GI: No organomegaly, abdomen is soft and non-tender Skin: No lesions in the area of chief complaint. He does have bruising distally along the posterolateral aspect of his leg. Neurologic: Sensation intact distally Psychiatric: Patient is competent for consent with normal mood and affect Lymphatic: No axillary or cervical lymphadenopathy  MUSCULOSKELETAL: Left hip has a positive log roll, and extreme pain with both active and passive motion. He cannot do a straight leg raise. He has pain directly over the lateral side of his left hip as well as in the groin. EHL and FHL are firing.  Assessment: Left hip pain, greater trochanteric hip fracture versus intertrochanteric hip fracture. He also has a history of gastric bypass, and probable osteoporosis.  Plan: He is eating admitted to the orthopedic service at [facility], will be given IV as well as by mouth analgesics. We will use sequential compression devices for DVT prophylaxis until we determine whether or not he will require surgery. I am repeating a set of x-rays today, and will see if  this does in fact demonstrate a intertrochanteric hip fracture. If so, then he will need surgical intervention. If not, then I'm going to get an MRI to rule out occult intertrochanteric hip fracture. This is an acute severe injury, and will threaten his independence, and has risks for both morbidity as well as mortality. He will be n.p.o. after midnight tonight, for possible surgery tomorrow, depending on the findings of the x-rays and possibly the MRI if needed. Clinically I do believe that he has a intertrochanteric hip fracture. We have also  discussed the risks benefits and alternatives to surgical intervention, as well as the potential for blood transfusion. I will also get an EKG, chest x-ray, and preoperative laboratory studies.  Eulas Post, MD 07/19/2011 9:15 PM

## 2011-07-19 NOTE — Progress Notes (Signed)
Orthopedic Tech Progress Note Patient Details:  [name] Dec 12, 1950 [id number] Trapeze bar patient helper      Nikki Dom 07/19/2011, 9:00 PM

## 2011-07-20 ENCOUNTER — Encounter (HOSPITAL_COMMUNITY): Payer: Self-pay | Admitting: Anesthesiology

## 2011-07-20 ENCOUNTER — Inpatient Hospital Stay (HOSPITAL_COMMUNITY): Payer: BC Managed Care – PPO | Admitting: Anesthesiology

## 2011-07-20 ENCOUNTER — Encounter (HOSPITAL_COMMUNITY): Payer: Self-pay | Admitting: Orthopedic Surgery

## 2011-07-20 ENCOUNTER — Inpatient Hospital Stay (HOSPITAL_COMMUNITY): Payer: BC Managed Care – PPO

## 2011-07-20 ENCOUNTER — Encounter (HOSPITAL_COMMUNITY)
Admission: AD | Disposition: A | Discharge status: Home Health | Payer: Self-pay | Source: Ambulatory Visit | Attending: Orthopedic Surgery

## 2011-07-20 DIAGNOSIS — S72142A Displaced intertrochanteric fracture of left femur, initial encounter for closed fracture: Secondary | ICD-10-CM | POA: Diagnosis present

## 2011-07-20 HISTORY — DX: Displaced intertrochanteric fracture of left femur, initial encounter for closed fracture: S72.142A

## 2011-07-20 HISTORY — PX: COMPRESSION HIP SCREW: SHX1386

## 2011-07-20 SURGERY — COMPRESSION HIP
Anesthesia: General | Site: Hip | Laterality: Left | Wound class: Clean

## 2011-07-20 MED ORDER — ENOXAPARIN SODIUM 40 MG/0.4ML ~~LOC~~ SOLN
40.0000 mg | SUBCUTANEOUS | Status: DC
  Administered 2011-07-20 – 2011-07-22 (×3): 40 mg via SUBCUTANEOUS
  Filled 2011-07-20 (×4): qty 0.4

## 2011-07-20 MED ORDER — HYDROMORPHONE HCL PF 1 MG/ML IJ SOLN
1.0000 mg | INTRAMUSCULAR | Status: DC | PRN
  Administered 2011-07-20 – 2011-07-22 (×11): 1 mg via INTRAVENOUS
  Filled 2011-07-20 (×12): qty 1

## 2011-07-20 MED ORDER — OXYCODONE HCL 5 MG PO TABS
5.0000 mg | ORAL_TABLET | Freq: Four times a day (QID) | ORAL | Status: DC | PRN
  Administered 2011-07-21 – 2011-07-22 (×4): 5 mg via ORAL
  Filled 2011-07-20 (×5): qty 1

## 2011-07-20 MED ORDER — POTASSIUM CHLORIDE IN NACL 20-0.45 MEQ/L-% IV SOLN
INTRAVENOUS | Status: DC
  Administered 2011-07-20 – 2011-07-21 (×2): via INTRAVENOUS
  Filled 2011-07-20 (×10): qty 1000

## 2011-07-20 MED ORDER — OXYCODONE-ACETAMINOPHEN 5-325 MG PO TABS
ORAL_TABLET | ORAL | Status: AC
  Filled 2011-07-20: qty 2

## 2011-07-20 MED ORDER — ACETAMINOPHEN 650 MG RE SUPP: 650.0000 mg | Freq: Four times a day (QID) | RECTAL | Status: DC | PRN

## 2011-07-20 MED ORDER — METHOCARBAMOL 500 MG PO TABS: 500.0000 mg | ORAL_TABLET | Freq: Four times a day (QID) | ORAL | Status: AC

## 2011-07-20 MED ORDER — OXYCODONE-ACETAMINOPHEN 5-325 MG PO TABS
1.0000 | ORAL_TABLET | ORAL | Status: DC | PRN
  Administered 2011-07-20: 2 via ORAL

## 2011-07-20 MED ORDER — METOCLOPRAMIDE HCL 5 MG/ML IJ SOLN
5.0000 mg | Freq: Three times a day (TID) | INTRAMUSCULAR | Status: DC | PRN
  Filled 2011-07-20: qty 2

## 2011-07-20 MED ORDER — WHITE PETROLATUM GEL
Status: AC
  Filled 2011-07-20: qty 5

## 2011-07-20 MED ORDER — ROCURONIUM BROMIDE 100 MG/10ML IV SOLN
INTRAVENOUS | Status: DC | PRN
  Administered 2011-07-20: 50 mg via INTRAVENOUS

## 2011-07-20 MED ORDER — ACETAMINOPHEN 325 MG PO TABS
650.0000 mg | ORAL_TABLET | Freq: Four times a day (QID) | ORAL | Status: DC | PRN
  Filled 2011-07-20: qty 2

## 2011-07-20 MED ORDER — CEFAZOLIN SODIUM-DEXTROSE 2-3 GM-% IV SOLR
2.0000 g | Freq: Four times a day (QID) | INTRAVENOUS | Status: AC
  Administered 2011-07-20 – 2011-07-21 (×3): 2 g via INTRAVENOUS
  Filled 2011-07-20 (×3): qty 50

## 2011-07-20 MED ORDER — CEFAZOLIN SODIUM 1-5 GM-% IV SOLN
INTRAVENOUS | Status: AC
  Filled 2011-07-20: qty 100

## 2011-07-20 MED ORDER — NEOSTIGMINE METHYLSULFATE 1 MG/ML IJ SOLN
INTRAMUSCULAR | Status: DC | PRN
  Administered 2011-07-20: 5 mg via INTRAVENOUS

## 2011-07-20 MED ORDER — CEFAZOLIN SODIUM 1-5 GM-% IV SOLN
INTRAVENOUS | Status: DC | PRN
  Administered 2011-07-20 (×2): 1 g via INTRAVENOUS

## 2011-07-20 MED ORDER — OXYCODONE-ACETAMINOPHEN 10-325 MG PO TABS: 1.0000 | ORAL_TABLET | Freq: Four times a day (QID) | ORAL | Status: AC | PRN

## 2011-07-20 MED ORDER — WARFARIN SODIUM 7.5 MG PO TABS
7.5000 mg | ORAL_TABLET | Freq: Once | ORAL | Status: AC
  Administered 2011-07-20: 7.5 mg via ORAL
  Filled 2011-07-20: qty 1

## 2011-07-20 MED ORDER — DEXTROSE 5 % IV SOLN: 500.0000 mg | Freq: Four times a day (QID) | INTRAVENOUS | Status: DC | PRN

## 2011-07-20 MED ORDER — HYDROMORPHONE HCL PF 1 MG/ML IJ SOLN
INTRAMUSCULAR | Status: AC
  Filled 2011-07-20: qty 1

## 2011-07-20 MED ORDER — ONDANSETRON HCL 4 MG/2ML IJ SOLN
INTRAMUSCULAR | Status: DC | PRN
  Administered 2011-07-20: 4 mg via INTRAVENOUS

## 2011-07-20 MED ORDER — ONDANSETRON HCL 4 MG PO TABS: 4.0000 mg | ORAL_TABLET | Freq: Four times a day (QID) | ORAL | Status: DC | PRN

## 2011-07-20 MED ORDER — METOCLOPRAMIDE HCL 10 MG PO TABS: 5.0000 mg | ORAL_TABLET | Freq: Three times a day (TID) | ORAL | Status: DC | PRN

## 2011-07-20 MED ORDER — ONDANSETRON HCL 4 MG/2ML IJ SOLN: 4.0000 mg | Freq: Once | INTRAMUSCULAR | Status: DC | PRN

## 2011-07-20 MED ORDER — HYDROMORPHONE HCL PF 1 MG/ML IJ SOLN
0.2500 mg | INTRAMUSCULAR | Status: DC | PRN
  Administered 2011-07-20 (×3): 0.5 mg via INTRAVENOUS

## 2011-07-20 MED ORDER — GLYCOPYRROLATE 0.2 MG/ML IJ SOLN
INTRAMUSCULAR | Status: DC | PRN
  Administered 2011-07-20: .8 mg via INTRAVENOUS

## 2011-07-20 MED ORDER — LACTATED RINGERS IV SOLN
INTRAVENOUS | Status: DC
  Administered 2011-07-20: 16:00:00 via INTRAVENOUS

## 2011-07-20 MED ORDER — ENOXAPARIN SODIUM 40 MG/0.4ML ~~LOC~~ SOLN: 40.0000 mg | SUBCUTANEOUS | Status: DC

## 2011-07-20 MED ORDER — PHENOL 1.4 % MT LIQD
1.0000 | OROMUCOSAL | Status: DC | PRN
  Filled 2011-07-20: qty 177

## 2011-07-20 MED ORDER — ZOLPIDEM TARTRATE 5 MG PO TABS
5.0000 mg | ORAL_TABLET | Freq: Every evening | ORAL | Status: DC | PRN
  Administered 2011-07-23 – 2011-07-24 (×2): 5 mg via ORAL
  Filled 2011-07-20 (×3): qty 1

## 2011-07-20 MED ORDER — COUMADIN BOOK
Freq: Once | Status: DC
  Filled 2011-07-20 (×3): qty 1

## 2011-07-20 MED ORDER — ONDANSETRON HCL 4 MG/2ML IJ SOLN: 4.0000 mg | Freq: Four times a day (QID) | INTRAMUSCULAR | Status: DC | PRN

## 2011-07-20 MED ORDER — MENTHOL 3 MG MT LOZG: 1.0000 | LOZENGE | OROMUCOSAL | Status: DC | PRN

## 2011-07-20 MED ORDER — BUPIVACAINE-EPINEPHRINE PF 0.5-1:200000 % IJ SOLN
INTRAMUSCULAR | Status: DC | PRN
  Administered 2011-07-20: 10 mL

## 2011-07-20 MED ORDER — METHOCARBAMOL 500 MG PO TABS: 500.0000 mg | ORAL_TABLET | Freq: Four times a day (QID) | ORAL | Status: DC | PRN

## 2011-07-20 MED ORDER — WARFARIN SODIUM 5 MG PO TABS: 5.0000 mg | ORAL_TABLET | Freq: Every day | ORAL | Status: DC

## 2011-07-20 MED ORDER — LACTATED RINGERS IV SOLN
INTRAVENOUS | Status: DC | PRN
  Administered 2011-07-20: 16:00:00 via INTRAVENOUS

## 2011-07-20 MED ORDER — PROPOFOL 10 MG/ML IV EMUL
INTRAVENOUS | Status: DC | PRN
  Administered 2011-07-20: 200 mg via INTRAVENOUS

## 2011-07-20 MED ORDER — WARFARIN VIDEO: Freq: Once | Status: DC

## 2011-07-20 MED ORDER — FENTANYL CITRATE 0.05 MG/ML IJ SOLN
INTRAMUSCULAR | Status: DC | PRN
  Administered 2011-07-20 (×2): 50 ug via INTRAVENOUS
  Administered 2011-07-20: 100 ug via INTRAVENOUS
  Administered 2011-07-20 (×3): 50 ug via INTRAVENOUS

## 2011-07-20 MED ORDER — ALUM & MAG HYDROXIDE-SIMETH 200-200-20 MG/5ML PO SUSP: 30.0000 mL | ORAL | Status: DC | PRN

## 2011-07-20 SURGICAL SUPPLY — 46 items
APL SKNCLS STERI-STRIP NONHPOA (GAUZE/BANDAGES/DRESSINGS) ×1
BANDAGE GAUZE ELAST BULKY 4 IN (GAUZE/BANDAGES/DRESSINGS) ×2 IMPLANT
BENZOIN TINCTURE PRP APPL 2/3 (GAUZE/BANDAGES/DRESSINGS) ×1 IMPLANT
BIT DRILL JC END 3.2X130 (BIT) ×1 IMPLANT
BNDG COHESIVE 4X5 TAN STRL (GAUZE/BANDAGES/DRESSINGS) ×2 IMPLANT
BOOTCOVER CLEANROOM LRG (PROTECTIVE WEAR) ×4 IMPLANT
CLOSURE STERI STRIP 1/2 X4 (GAUZE/BANDAGES/DRESSINGS) ×1 IMPLANT
CLOTH BEACON ORANGE TIMEOUT ST (SAFETY) ×2 IMPLANT
COVER SURGICAL LIGHT HANDLE (MISCELLANEOUS) ×2 IMPLANT
DRAPE STERI IOBAN 125X83 (DRAPES) ×2 IMPLANT
DRSG MEPILEX BORDER 4X4 (GAUZE/BANDAGES/DRESSINGS) ×2 IMPLANT
DRSG MEPILEX BORDER 4X8 (GAUZE/BANDAGES/DRESSINGS) ×2 IMPLANT
DRSG PAD ABDOMINAL 8X10 ST (GAUZE/BANDAGES/DRESSINGS) ×2 IMPLANT
DURAPREP 26ML APPLICATOR (WOUND CARE) ×3 IMPLANT
ELECT CAUTERY BLADE 6.4 (BLADE) ×2 IMPLANT
ELECT REM PT RETURN 9FT ADLT (ELECTROSURGICAL) ×2
ELECTRODE REM PT RTRN 9FT ADLT (ELECTROSURGICAL) ×1 IMPLANT
GAUZE XEROFORM 5X9 LF (GAUZE/BANDAGES/DRESSINGS) ×2 IMPLANT
GLOVE BIOGEL PI IND STRL 8 (GLOVE) ×1 IMPLANT
GLOVE BIOGEL PI INDICATOR 8 (GLOVE) ×1
GLOVE ORTHO TXT STRL SZ7.5 (GLOVE) ×3 IMPLANT
GLOVE SURG ORTHO 8.0 STRL STRW (GLOVE) ×4 IMPLANT
GOWN STRL NON-REIN LRG LVL3 (GOWN DISPOSABLE) ×2 IMPLANT
GUIDEPIN DHS/DCS (PIN) ×1 IMPLANT
KIT ROOM TURNOVER OR (KITS) ×2 IMPLANT
MANIFOLD NEPTUNE II (INSTRUMENTS) ×2 IMPLANT
NDL HYPO 25GX1X1/2 BEV (NEEDLE) IMPLANT
NEEDLE HYPO 25GX1X1/2 BEV (NEEDLE) ×2 IMPLANT
NS IRRIG 1000ML POUR BTL (IV SOLUTION) ×2 IMPLANT
PACK GENERAL/GYN (CUSTOM PROCEDURE TRAY) ×2 IMPLANT
PAD ARMBOARD 7.5X6 YLW CONV (MISCELLANEOUS) ×4 IMPLANT
PLATE DHS 135 DEG/4HOLE (Plate) ×1 IMPLANT
SCREW CORTEX ST 4.5X42 (Screw) ×2 IMPLANT
SCREW CORTEX ST 4.5X44 (Screw) ×1 IMPLANT
SCREW DHS LAG 120MM (Screw) ×1 IMPLANT
STAPLER VISISTAT 35W (STAPLE) ×2 IMPLANT
SUT MNCRL AB 4-0 PS2 18 (SUTURE) ×1 IMPLANT
SUT VIC AB 0 CTB1 27 (SUTURE) ×2 IMPLANT
SUT VIC AB 1 CT1 27 (SUTURE) ×6
SUT VIC AB 1 CT1 27XBRD ANBCTR (SUTURE) IMPLANT
SUT VIC AB 2-0 CT1 27 (SUTURE) ×2
SUT VIC AB 2-0 CT1 TAPERPNT 27 (SUTURE) ×1 IMPLANT
SYR CONTROL 10ML LL (SYRINGE) ×1 IMPLANT
TOWEL OR 17X24 6PK STRL BLUE (TOWEL DISPOSABLE) ×2 IMPLANT
TOWEL OR 17X26 10 PK STRL BLUE (TOWEL DISPOSABLE) ×2 IMPLANT
WATER STERILE IRR 1000ML POUR (IV SOLUTION) ×1 IMPLANT

## 2011-07-20 NOTE — Transfer of Care (Signed)
Immediate Anesthesia Transfer of Care Note  Patient: [name]  Procedure(s) Performed:  COMPRESSION HIP  Patient Location: PACU  Anesthesia Type: General  Level of Consciousness: awake, alert  and oriented  Airway & Oxygen Therapy: Patient Spontanous Breathing and Patient connected to nasal cannula oxygen  Post-op Assessment: Report given to PACU RN  Post vital signs: Reviewed and stable  Complications: No apparent anesthesia complications

## 2011-07-20 NOTE — Op Note (Signed)
DATE OF SURGERY:  07/20/2011  TIME: 6:41 PM  PATIENT NAME:  [name]  AGE: 61 y.o.  PRE-OPERATIVE DIAGNOSIS:  Left intertrochanteric hip fracture  POST-OPERATIVE DIAGNOSIS:  SAME  PROCEDURE:  COMPRESSION HIP SCREW placement, left hip  SURGEON:  Adylin Hankey P  ASSISTANT: None  OPERATIVE IMPLANTS: Synthes dynamic hip screw with a size 120 mm cephalo-medullary lag screw and a total of 3 bicortical distal screws  PREOPERATIVE INDICATIONS:  [name] is a 61 y.o. year old who fell and suffered a hip fracture. Initially, it was not displaced, and could not be appreciated on plain radiographs. He was doing poorly clinically, and ultimately was admitted to the hospital where I ordered an MRI. This did in fact confirm a nondisplaced intertrochanteric hip fracture. He has substantial left knee osteoarthritis. He will likely need a total knee replacement sometime in the future. For this reason I recommended a dynamic hip screw in order to maintain the intramedullary femur for instrumentation for the total knee.   The risks benefits and alternatives were discussed with the patient including but not limited to the risks of nonoperative treatment, versus surgical intervention including infection, bleeding, nerve injury, malunion, nonunion, hardware prominence, hardware failure, need for hardware removal, blood clots, cardiopulmonary complications, morbidity, mortality, among others, and they were willing to proceed.  Predicted outcome is good, although there will be at least a six to nine month expected recovery.  OPERATIVE PROCEDURE:  The patient was brought to the operating room and placed in the supine position. General anesthesia was administered. He was placed on the fracture table.  Closed reduction was performed under C-arm guidance.  no significant reduction was required given the nondisplaced nature. Time out was then performed after sterile prep and drape. He received preoperative  antibioticsin the form of Ancef, 2 g.   A lateral incision was made over the proximal femur. Dissection was carried down through the iliotibial band. The liters were coagulated. The vastus lateralis was retracted anteriorly. The lateral ridge was identified. A guidewire was placed using the 135 mm lag screw guide. This was placed into the center center position. I then measured the length, which was between 120 and 125. I selected a 120, and then drilled the head. I also tapped the head. Bone quality was poor up until the very last portion of the head, which then had good bone to work with. I a placed the lag screw into the appropriate position, and then applied a 4 hole 135 compression plate.  I then secured the plate with 3 proximal screws. I did not need the fourth screw. I was also maintaining the intramedullary length, just in case the guide on the femoral system for the total knee may need to go up that high. I confirmed the position of the plate and screws on AP and lateral views. I could palpate that the screw tips were just protruding through the medial cortex, and therefore was bicortical, with all 3 screws.  Final C-arm pictures were taken, and I irrigated the wound copiously, and repaired the iliotibial band with 0 Vicryl, followed by 2-0 for the subcutaneous tissue, and Monocryl with Steri-Strips and sterile gauze for the skin. I also injected his wounds. He was awakened and returned to PACU in stable and satisfactory condition. No complications.  He  will be weightbearing as tolerated, and will be on Lovenox bridging to Coumadin for a period of one month with a goal INR of 2-3.    Teryl Lucy,  M.D. 

## 2011-07-20 NOTE — Anesthesia Preprocedure Evaluation (Addendum)
Anesthesia Evaluation  Patient identified by MRN, date of birth, ID band Patient awake    Reviewed: Allergy & Precautions, H&P , NPO status , Patient's Chart, lab work & pertinent test results  Airway Mallampati: I TM Distance: >3 FB Neck ROM: Full    Dental  (+) Teeth Intact and Dental Advisory Given   Pulmonary neg pulmonary ROS,          Cardiovascular neg cardio ROS regular Normal    Neuro/Psych Negative Neurological ROS     GI/Hepatic negative GI ROS, Neg liver ROS,   Endo/Other  Negative Endocrine ROS  Renal/GU negative Renal ROS  Genitourinary negative   Musculoskeletal   Abdominal   Peds  Hematology negative hematology ROS (+)   Anesthesia Other Findings   Reproductive/Obstetrics                          Anesthesia Physical Anesthesia Plan  ASA: II  Anesthesia Plan: General ETT   Post-op Pain Management:    Induction: Intravenous  Airway Management Planned: Oral ETT  Additional Equipment:   Intra-op Plan:   Post-operative Plan:   Informed Consent: I have reviewed the patients History and Physical, chart, labs and discussed the procedure including the risks, benefits and alternatives for the proposed anesthesia with the patient or authorized representative who has indicated his/her understanding and acceptance.     Plan Discussed with: Anesthesiologist, CRNA and Surgeon  Anesthesia Plan Comments:         Anesthesia Quick Evaluation

## 2011-07-20 NOTE — Anesthesia Postprocedure Evaluation (Signed)
Anesthesia Post Note  Patient: [name]  Procedure(s) Performed:  COMPRESSION HIP  Anesthesia type: General  Patient location: PACU  Post pain: Pain level controlled  Post assessment: Patient's Cardiovascular Status Stable  Last Vitals:  Filed Vitals:   07/20/11 1930  BP:   Pulse:   Temp: 36.9 C  Resp:     Post vital signs: Reviewed and stable  Level of consciousness: alert  Complications: No apparent anesthesia complications

## 2011-07-20 NOTE — Progress Notes (Signed)
ANTICOAGULATION CONSULT NOTE - Initial Consult  Pharmacy Consult for Coumadin Indication: VTE prophylaxis  No Known Allergies  Patient Measurements: Height: 6\' 1"  (185.4 cm) Weight: 204 lb 2.3 oz (92.6 kg) IBW/kg (Calculated) : 79.9   Vital Signs: Temp: 98.4 F (36.9 C) (01/21 1930) Temp src: Oral (01/21 1437) BP: 128/73 mmHg (01/21 1935) Pulse Rate: 63  (01/21 1930)  Labs:  Alvira Philips 07/19/11 2037  HGB 10.8*  HCT 33.1*  PLT 214  APTT --  LABPROT 13.5  INR 1.01  HEPARINUNFRC --  CREATININE 0.51  CKTOTAL --  CKMB --  TROPONINI --   Estimated Creatinine Clearance: 108.2 ml/min (by C-G formula based on Cr of 0.51).  Medical History: Past Medical History  Diagnosis Date  . 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 Left patella fracture 07/19/2011  . Arthritis   . Closed intertrochanteric fracture of left hip 07/20/2011    Medications:  Prescriptions prior to admission  Medication Sig Dispense Refill  . Calcium Carbonate-Vitamin D (CALCIUM 600 + D PO) Take 1 tablet by mouth daily.      Marland Kitchen gabapentin (NEURONTIN) 300 MG capsule Take 600 mg by mouth 2 (two) times daily.      . Misc Natural Products (OSTEO BI-FLEX ADV DOUBLE ST) CAPS Take 1 capsule by mouth daily.      . traMADol (ULTRAM) 50 MG tablet Take 50 mg by mouth every 6 (six) hours as needed. For pain      . DISCONTD: Acetaminophen 650 MG TABS Take 1 tablet by mouth 3 (three) times daily as needed. For pain      . DISCONTD: HYDROcodone-acetaminophen (NORCO) 10-325 MG per tablet Take 1 tablet by mouth every 6 (six) hours as needed. For pain        Assessment: 61 yo M s/p mechanical fall approximately 1 week ago who presented to the hospital 07/19/11 and was found to have a hip fracture.  Pt went to OR today for screw placement.  To start Coumadin for post-op VTE prophylaxis.  Goal of Therapy:  INR 2-3   Plan:  Coumadin 7.5mg  PO x 1 tonight. Daily INR. Coumadin book and video.  [employer], Pharm.D.,  BCPS Clinical Pharmacist Pager [phone]  07/20/2011,8:30 PM

## 2011-07-20 NOTE — Interval H&P Note (Signed)
History and Physical Interval Note:  07/20/2011 3:26 PM  [name]  has presented today for surgery, with the diagnosis of Left intertrochanteric hip fracture  The various methods of treatment have been discussed with the patient and family. After consideration of risks, benefits and other options for treatment, the patient has consented to  Procedure(s): COMPRESSION HIP as a surgical intervention .  The patients' history has been reviewed, patient examined, no change in status, stable for surgery.  I have reviewed the patients' chart and labs.  Questions were answered to the patient's satisfaction.    His MRI did in fact confirm a nondisplaced intertrochanteric hip fracture. Therefore we will proceed with dynamic hip screw placement.   Nettie Cromwell P

## 2011-07-20 NOTE — Preoperative (Signed)
Beta Blockers   Reason not to administer Beta Blockers:Not Applicable 

## 2011-07-21 ENCOUNTER — Encounter (HOSPITAL_COMMUNITY): Payer: Self-pay | Admitting: Orthopedic Surgery

## 2011-07-21 LAB — BASIC METABOLIC PANEL
CO2: 33 mEq/L — ABNORMAL HIGH (ref 19–32)
Calcium: 8.5 mg/dL (ref 8.4–10.5)
Creatinine, Ser: 0.5 mg/dL (ref 0.50–1.35)
GFR calc non Af Amer: >90 mL/min (ref >90)
Glucose, Bld: 86 mg/dL (ref 70–99)
Sodium: 137 mEq/L (ref 135–145)

## 2011-07-21 LAB — CBC
MCH: 29.4 pg (ref 26.0–34.0)
MCHC: 32.5 g/dL (ref 30.0–36.0)
MCV: 90.3 fL (ref 78.0–100.0)
Platelets: 251 10*3/uL (ref 150–400)

## 2011-07-21 LAB — PROTIME-INR: Prothrombin Time: 14.9 seconds (ref 11.6–15.2)

## 2011-07-21 MED ORDER — WARFARIN SODIUM 7.5 MG PO TABS
7.5000 mg | ORAL_TABLET | Freq: Once | ORAL | Status: AC
  Administered 2011-07-21: 7.5 mg via ORAL
  Filled 2011-07-21: qty 1

## 2011-07-21 NOTE — Progress Notes (Signed)
ANTICOAGULATION CONSULT NOTE - Follow-Up Consult  Pharmacy Consult for Coumadin Indication: VTE prophylaxis s/p L hip screw placement  No Known Allergies  Patient Measurements: Height: 6\' 1"  (185.4 cm) Weight: 204 lb 2.3 oz (92.6 kg) IBW/kg (Calculated) : 79.9   Vital Signs: Temp: 98.3 F (36.8 C) (01/22 0625) BP: 118/66 mmHg (01/22 0625) Pulse Rate: 88  (01/22 0625)  Labs:  Alvira Philips 07/21/11 0635 07/19/11 2037  HGB 10.9* 10.8*  HCT 33.5* 33.1*  PLT 251 214  APTT -- --  LABPROT 14.9 13.5  INR 1.15 1.01  HEPARINUNFRC -- --  CREATININE 0.50 0.51  CKTOTAL -- --  CKMB -- --  TROPONINI -- --   Estimated Creatinine Clearance: 108.2 ml/min (by C-G formula based on Cr of 0.5).   Assessment: INR moving nicely past 7.5 mg dose yesterday. No bleeding noted. Also on lovenox 40mg  SQ daily until INR therapeutic.  Goal of Therapy:  INR 2-3   Plan:  Coumadin 7.5mg  PO x 1 tonight. Daily INR.  Christoper Fabian, Pharm.D., BCPS Clinical Pharmacist Pager [phone]  07/21/2011,11:45 AM

## 2011-07-21 NOTE — Evaluation (Signed)
Physical Therapy Evaluation Patient Details Name: [name] MRN: [id number] DOB: 16-Nov-1950 Today's Date: 07/21/2011  Problem List:  Patient Active Problem List  Diagnoses  . S/P gastric bypass  . Left patella fracture  . Closed intertrochanteric fracture of left hip    Past Medical History:  Past Medical History  Diagnosis Date  . 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 Left patella fracture 07/19/2011  . Arthritis   . Closed intertrochanteric fracture of left hip 07/20/2011   Past Surgical History:  Past Surgical History  Procedure Date  . 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 Pt will benefit from acute PT services to address deficits and increase functional independence. PT Recommendation/Assessment: Patient will need skilled PT in the acute care venue PT Problem List: Decreased strength;Decreased range of motion;Decreased activity tolerance;Decreased balance;Decreased mobility;Decreased knowledge of use of DME;Pain Barriers to Discharge: Inaccessible home environment Barriers to Discharge Comments: pt with 8 steps to enter home PT Therapy Diagnosis : Difficulty walking;Generalized weakness;Acute pain PT Plan PT Frequency: Min 5X/week PT Treatment/Interventions: DME instruction;Gait training;Stair training;Functional mobility training;Therapeutic activities;Therapeutic exercise;Balance training;Patient/family education PT Recommendation Follow Up Recommendations: Home health PT Equipment Recommended: None recommended by PT (pt states he owns a RW) PT Goals  Acute Rehab PT Goals PT Goal Formulation: With patient Time For Goal Achievement: 7 days Pt will go Supine/Side to Sit: with modified independence Pt will  Transfer Bed to Chair/Chair to Bed: with modified independence Pt will Ambulate: 51 - 150 feet;with modified independence Pt will Go Up / Down Stairs: 6-9 stairs;with min assist  PT Evaluation Precautions/Restrictions  Precautions Precautions: Fall Restrictions Weight Bearing Restrictions: Yes LLE Weight Bearing: Weight bearing as tolerated Prior Functioning  Home Living Lives With: Spouse Receives Help From: Family Type of Home: House Home Layout: One level Home Access: Stairs to enter Entrance Stairs-Rails: Doctor, general practice of Steps: 8 Home Adaptive Equipment: Crutches;Straight cane;Walker - rolling Prior Function Level of Independence: Independent with basic ADLs;Independent with transfers;Requires assistive device for independence (used cane) Driving: Yes Vocation: Full time employment Cognition Cognition Arousal/Alertness: Awake/alert Overall Cognitive Status: Appears within functional limits for tasks assessed Sensation/Coordination Sensation Light Touch: Appears Intact Proprioception: Appears Intact Coordination Gross Motor Movements are Fluid and Coordinated: Yes Fine Motor Movements are Fluid and Coordinated: Yes Extremity Assessment RLE Assessment RLE Assessment: Within Functional Limits LLE Assessment LLE Assessment: Exceptions to WFL LLE AROM (degrees) Overall AROM Left Lower Extremity: Due to pain (decreased ROM hip and knee due to pain, ankle WFL) LLE Strength LLE Overall Strength: Deficits;Due to pain (hip/knee 3-/5 due to pain, ankle 3/5) Mobility (including Balance) Bed Mobility Bed Mobility: Yes Supine to Sit: 3: Mod assist Supine to Sit Details (indicate cue type and reason): with trapeze bar, therapist holding pt's L leg due to pain Sitting - Scoot to Edge of Bed: 4: Min assist Sitting - Scoot to [place] of Bed Details (indicate cue type and reason): therapist holding L LE due to pain Transfers Transfers: Yes Sit to Stand: 3: Mod  assist Sit to Stand Details (indicate cue type and reason): with RW, cues for hand placement and LE placement to reduce pain, pt with decreased wt on L LE Stand Pivot Transfers: 4: Min assist Stand Pivot Transfer Details (indicate cue type and reason): with RW steadying assist for balance  with decreased wt on L LE Ambulation/Gait Ambulation/Gait: Yes Ambulation/Gait Assistance: 4: Min assist Ambulation/Gait Assistance Details (indicate cue type and reason): with RW, step to pattern, limited by pain Ambulation Distance (Feet): 5 Feet Assistive device: Rolling walker Gait velocity: slow Stairs: No Wheelchair Mobility Wheelchair Mobility: No  Balance Balance Assessed: Yes Static Standing Balance Static Standing - Level of Assistance: 4: Min assist (with 1 UE support x 3 mins for functional task) End of Session PT - End of Session Equipment Utilized During Treatment: Gait belt Activity Tolerance: Patient limited by pain Patient left: in chair;with call bell in reach Nurse Communication: Mobility status for transfers General Behavior During Session: [facility] for tasks performed Cognition: [facility] for tasks performed  [facility] 07/21/2011, 9:37 AM

## 2011-07-21 NOTE — Progress Notes (Signed)
Patient ID: [name], male   DOB: 03-19-1951, 61 y.o.   MRN: [id number]  Procedure(s) (LRB): COMPRESSION HIP (Left) 1 Day Post-Op   Subjective:  Patient reports pain as moderate.  He is overall feeling well, and making progress. He is currently sitting in a chair.  Objective:   VITALS:  BP 97/58  Pulse 92  Temp(Src) 98.4 F (36.9 C) (Oral)  Resp 18  Ht 6\' 1"  (1.854 m)  Wt 92.6 kg (204 lb 2.3 oz)  BMI 26.93 kg/m2  SpO2 99%  Neurologically intact Neurovascular intact Dorsiflexion/Plantar flexion intact Incision: dressing C/D/I  LABS Lab Results  Component Value Date   HGB 10.9* 07/21/2011   HGB 10.8* 07/19/2011   HGB 14.3 07/23/2007   Lab Results  Component Value Date   WBC 8.5 07/21/2011   PLT 251 07/21/2011   Lab Results  Component Value Date   INR 1.15 07/21/2011   Lab Results  Component Value Date   NA 137 07/21/2011   K 4.2 07/21/2011   CL 99 07/21/2011   CO2 33* 07/21/2011   BUN 5* 07/21/2011   CREATININE 0.50 07/21/2011   GLUCOSE 86 07/21/2011    Assessment/Plan: Principal Problem:  *Closed intertrochanteric fracture of left hip Active Problems:  S/P gastric bypass   Advance diet Up with therapy  Home with home health versus skilled nursing depending on physical therapy.  Continue with Coumadin, with a Lovenox bridge.   Calieb Lichtman P 07/21/2011, 5:42 PM

## 2011-07-21 NOTE — Evaluation (Signed)
Occupational Therapy Evaluation Patient Details Name: [name] MRN: [id number] DOB: 06/16/1951 Today's Date: 07/21/2011 Pain 7/10 Made RN aware Time [phone]   Problem List:  Patient Active Problem List  Diagnoses  . S/P gastric bypass  . Left patella fracture  . Closed intertrochanteric fracture of left hip    Past Medical History:  Past Medical History  Diagnosis Date  . 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 Left patella fracture 07/19/2011  . Arthritis   . Closed intertrochanteric fracture of left hip 07/20/2011   Past Surgical History:  Past Surgical History  Procedure Date  . 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  Pt presents with acute pain, generalized weakness, limited ROM in left LE, decreased balance, standing tolerance limiting  his ADL performance; recommended  continued skilled OT. OT Recommendation/Assessment: Patient will need skilled OT in the acute care venue OT Problem List: Decreased strength;Impaired balance (sitting and/or standing);Decreased activity tolerance;Decreased range of motion;Pain OT Therapy Diagnosis : Generalized weakness;Acute pain OT Plan OT Frequency: Min 2X/week OT Treatment/Interventions: Self-care/ADL training;Therapeutic exercise;DME and/or AE instruction;Patient/family education;Balance training;Therapeutic activities OT Recommendation Follow Up Recommendations: Home health OT Equipment Recommended: None recommended by OT Individuals Consulted Consulted and Agree with Results and Recommendations: Patient OT Goals Acute Rehab OT Goals OT Goal Formulation: With patient Time For Goal Achievement: 7 days ADL Goals Pt Will Perform Grooming: with modified independence;Standing at sink Pt Will Perform Lower Body Bathing: with set-up;Sit  to stand from chair Pt Will Perform Lower Body Dressing: with set-up;Sit to stand from chair Pt Will Transfer to Toilet: with modified independence;Ambulation Pt Will Perform Toileting - Clothing Manipulation: with modified independence;Standing Pt Will Perform Toileting - Hygiene: with modified independence;Sit to stand from 3-in-1/toilet Pt Will Perform Tub/Shower Transfer: with supervision;Ambulation;Shower seat with back  OT Evaluation Precautions/Restrictions  Precautions Precautions: Fall Required Braces or Orthoses: No Restrictions Weight Bearing Restrictions: No LLE Weight Bearing: Weight bearing as tolerated Prior Functioning Home Living Lives With: Spouse Receives Help From: Family Type of Home: House Home Layout: One level Home Access: Stairs to enter Entrance Stairs-Rails: Doctor, general practice of Steps: 8 Bathroom Shower/Tub: Health visitor: Standard Home Adaptive Equipment: Bedside commode/3-in-1;Built-in shower seat;Walker - rolling Additional Comments: Pt has all OT DME from previous falls/injuries Prior Function Level of Independence: Independent with basic ADLs;Independent with homemaking with ambulation (before his fall) Driving: Yes Vocation: Full time employment ADL ADL Eating/Feeding: Independent;Performed Grooming: Simulated;Independent Where Assessed - Grooming: Sitting, chair Upper Body Bathing: Performed;Chest;Right arm;Left arm;Abdomen;Set up Where Assessed - Upper Body Bathing: Sit to stand from chair Lower Body Bathing: Performed;Minimal assistance Lower Body Bathing Details (indicate cue type and reason): Assistance for feet Where Assessed - Lower Body Bathing: Sit to stand from chair Upper Body Dressing: Performed;Set up Where Assessed - Upper Body Dressing: Sitting, chair Lower Body Dressing: Performed;Maximal assistance Lower Body Dressing Details (indicate cue type and reason): able to don underwear, unable to  don/doff sock on left LE, unable to don and fasten shoes, min a for sit to stand Where Assessed - Lower Body Dressing: Sit to stand from chair Toilet Transfer: Simulated;Minimal assistance Toileting - Clothing Manipulation: Simulated;Minimal assistance Toileting - Clothing Manipulation Details (indicate cue type and reason): simulated with dressing ; sit to stand Toileting - Hygiene: Minimal assistance;Simulated Toileting - Hygiene Details (indicate cue type  and reason): simulated with bathing- sit to stand Ambulation Related to ADLs: Pt performed a few steps forward and backward with verbal cues for safe RW use and stepping ; simulating transfes and short functional ambulation ADL Comments: Pt is limited by pain in his ADL performance Vision/Perception  Vision - History Baseline Vision: No visual deficits Patient Visual Report: No change from baseline Vision - Assessment Eye Alignment: Within Functional Limits Perception Perception: Within Functional Limits Praxis Praxis: Intact Cognition Cognition Arousal/Alertness: Awake/alert Overall Cognitive Status: Appears within functional limits for tasks assessed Orientation Level: Oriented X4 Sensation/Coordination Sensation Light Touch: Appears Intact Stereognosis: Appears Intact Hot/Cold: Appears Intact Proprioception: Appears Intact Coordination Gross Motor Movements are Fluid and Coordinated: Yes Fine Motor Movements are Fluid and Coordinated: Yes Extremity Assessment RUE Assessment RUE Assessment: Within Functional Limits LUE Assessment LUE Assessment: Within Functional Limits Mobility   Transfers Transfers: Yes Sit to Stand: 4: Min assist Sit to Stand Details (indicate cue type and reason): from slightly elevated surface. Stand to Sit: 4: Min assist End of Session OT - End of Session Equipment Utilized During Treatment:  (RW) Activity Tolerance: Patient limited by pain Patient left: in chair;with call bell in  reach Nurse Communication: Mobility status for transfers General Behavior During Session: [facility] for tasks performed Cognition: [facility] for tasks performed   Melonie Florida 07/21/2011, 11:27 AM

## 2011-07-22 LAB — PROTIME-INR
INR: 2.16 — ABNORMAL HIGH (ref 0.00–1.49)
Prothrombin Time: 24.5 seconds — ABNORMAL HIGH (ref 11.6–15.2)

## 2011-07-22 LAB — BASIC METABOLIC PANEL
BUN: 6 mg/dL (ref 6–23)
CO2: 30 mEq/L (ref 19–32)
Chloride: 100 mEq/L (ref 96–112)
GFR calc non Af Amer: >90 mL/min (ref >90)
Glucose, Bld: 85 mg/dL (ref 70–99)
Potassium: 4.2 mEq/L (ref 3.5–5.1)
Sodium: 137 mEq/L (ref 135–145)

## 2011-07-22 LAB — CBC
HCT: 29.8 % — ABNORMAL LOW (ref 39.0–52.0)
Hemoglobin: 9.8 g/dL — ABNORMAL LOW (ref 13.0–17.0)
MCHC: 32.9 g/dL (ref 30.0–36.0)
RBC: 3.37 MIL/uL — ABNORMAL LOW (ref 4.22–5.81)
WBC: 7.6 10*3/uL (ref 4.0–10.5)

## 2011-07-22 MED ORDER — OXYCODONE-ACETAMINOPHEN 5-325 MG PO TABS
1.0000 | ORAL_TABLET | Freq: Four times a day (QID) | ORAL | Status: DC | PRN
  Administered 2011-07-22: 1 via ORAL
  Filled 2011-07-22: qty 2

## 2011-07-22 MED ORDER — BISACODYL 10 MG RE SUPP: 10.0000 mg | Freq: Every day | RECTAL | Status: DC | PRN

## 2011-07-22 MED ORDER — SENNA 8.6 MG PO TABS
1.0000 | ORAL_TABLET | Freq: Every day | ORAL | Status: DC
  Administered 2011-07-22 – 2011-07-24 (×3): 8.6 mg via ORAL
  Filled 2011-07-22 (×4): qty 1

## 2011-07-22 MED ORDER — METHOCARBAMOL 500 MG PO TABS
500.0000 mg | ORAL_TABLET | Freq: Four times a day (QID) | ORAL | Status: DC
  Administered 2011-07-22 – 2011-07-25 (×12): 500 mg via ORAL
  Filled 2011-07-22 (×17): qty 1

## 2011-07-22 MED ORDER — DOCUSATE SODIUM 100 MG PO CAPS
100.0000 mg | ORAL_CAPSULE | Freq: Two times a day (BID) | ORAL | Status: DC
  Administered 2011-07-22 – 2011-07-25 (×6): 100 mg via ORAL
  Filled 2011-07-22 (×8): qty 1

## 2011-07-22 MED ORDER — OXYCODONE-ACETAMINOPHEN 5-325 MG PO TABS
1.0000 | ORAL_TABLET | ORAL | Status: DC | PRN
  Administered 2011-07-22: 1 via ORAL
  Administered 2011-07-23 – 2011-07-25 (×9): 2 via ORAL
  Filled 2011-07-22 (×10): qty 2

## 2011-07-22 NOTE — Progress Notes (Signed)
Physical Therapy Treatment Patient Details Name: [name] MRN: [id number] DOB: 04-30-1951 Today's Date: 07/22/2011  PT Assessment/Plan  PT - Assessment/Plan Comments on Treatment Session: Pt limited today due to pain. Pt had pain meds this morning, but unable to tolerate much activity due to pain.  Pt reported to MD he wanted to D/C home . Feel pt will not be ready until maybe Friday depending on pain level and tolerance to activity. Pt has 8 steps to enter and will need step training prior to d/c home.  Agree with HHPT follow-up. PT Plan: Discharge plan remains appropriate PT Frequency: Min 5X/week Follow Up Recommendations: Home health PT Equipment Recommended: Rolling walker with 5" wheels PT Goals  Acute Rehab PT Goals Pt will go Sit to Supine/Side: with modified independence PT Goal: Sit to Supine/Side - Progress: Goal set today Pt will go Sit to Stand: with modified independence PT Goal: Sit to Stand - Progress: Goal set today PT Transfer Goal: Bed to Chair/Chair to Bed - Progress: Progressing toward goal PT Goal: Ambulate - Progress: Progressing toward goal PT Goal: Up/Down Stairs - Progress: Other (comment) (unable to try due to pain)  PT Treatment Precautions/Restrictions  Precautions Precautions: Fall Required Braces or Orthoses: No Restrictions Weight Bearing Restrictions: No LLE Weight Bearing: Weight bearing as tolerated Mobility (including Balance) Bed Mobility Bed Mobility: Yes Sit to Supine: 4: Min assist Sit to Supine - Details (indicate cue type and reason): assistance with left LE and cues for technique Scooting to HOB: 4: Min assist Scooting to [facility] Details (indicate cue type and reason): assistance with left LE and cues for technique/hand positioning for increased independence Transfers Transfers: Yes Sit to Stand: 3: Mod assist;With upper extremity assist;From chair/3-in-1 Sit to Stand Details (indicate cue type and reason): cues for increasing left LE  knee flexion to assist with stand and cues for hand placement Stand to Sit: 4: Min assist;With upper extremity assist;To bed Stand to Sit Details: cues for technique Ambulation/Gait Ambulation/Gait: Yes Ambulation/Gait Assistance: 4: Min assist Ambulation/Gait Assistance Details (indicate cue type and reason): Cues for safety with walker. Pt steps too far forward with left LE past front of RW. Cues to increase right step length. Ambulation Distance (Feet): 10 Feet Assistive device: Rolling walker Gait Pattern: Decreased step length - right;Decreased stance time - left Gait velocity: slow secondary to pain Stairs: No  Posture/Postural Control Posture/Postural Control: No significant limitations Exercise  General Exercises - Lower Extremity Ankle Circles/Pumps: AROM;Strengthening;Both;10 reps;Supine Quad Sets: AROM;Strengthening;Both;10 reps;Supine Heel Slides: AROM;Strengthening;Both;5 reps;Supine End of Session PT - End of Session Equipment Utilized During Treatment: Gait belt Activity Tolerance: Patient limited by pain Patient left: in bed;with call bell in reach;Other (comment) (RN in room) Nurse Communication: Mobility status for transfers General Behavior During Session: Other (comment) (crying due to pain) Cognition: WFL for tasks performed  Greggory Stallion 07/22/2011, 10:56 AM

## 2011-07-22 NOTE — Progress Notes (Signed)
Utilization review completed. Charidy Cappelletti, RN, BSN. 07/22/11  

## 2011-07-22 NOTE — Progress Notes (Signed)
Subjective: 2 Days Post-Op Procedure(s) (LRB): COMPRESSION HIP (Left) Patient reports pain as 7 on 0-10 scale.   Patient requests that he be DC to home due to his work.  He was advised that this would be based on several factors and would be better evaluated after PT  Objective: Vital signs in last 24 hours: Temp:  [98.4 F (36.9 C)-98.8 F (37.1 C)] 98.8 F (37.1 C) (01/23 0509) Pulse Rate:  [84-92] 86  (01/23 0509) Resp:  [18] 18  (01/23 0509) BP: (97-110)/(58-74) 109/74 mmHg (01/23 0509) SpO2:  [95 %-99 %] 95 % (01/23 0509)  Intake/Output from previous day: 01/22 0701 - 01/23 0700 In: 2410 [P.O.:1320; I.V.:1090] Out: 1650 [Urine:1650] Intake/Output this shift:     Basename 07/22/11 0515 07/21/11 0635 07/19/11 2037  HGB 9.8* 10.9* 10.8*    Basename 07/22/11 0515 07/21/11 0635  WBC 7.6 8.5  RBC 3.37* 3.71*  HCT 29.8* 33.5*  PLT 228 251    Basename 07/22/11 0515 07/21/11 0635  NA 137 137  K 4.2 4.2  CL 100 99  CO2 30 33*  BUN 6 5*  CREATININE 0.53 0.50  GLUCOSE 85 86  CALCIUM 8.3* 8.5    Basename 07/22/11 0515 07/21/11 0635  LABPT -- --  INR 2.16* 1.15    ABD soft Sensation intact distally Intact pulses distally Dorsiflexion/Plantar flexion intact Incision: dressing C/D/I and scant drainage  Assessment/Plan: 2 Days Post-Op Procedure(s) (LRB): COMPRESSION HIP (Left) Advance diet Up with therapy Plan for discharge tomorrow We will see if he is independent enough to go home, otherwise may need skilled nursing placement for short-term rehabilitation.  Haskel Khan 07/22/2011, 8:41 AM

## 2011-07-22 NOTE — Progress Notes (Signed)
ANTICOAGULATION CONSULT NOTE - Follow-Up Consult  Pharmacy Consult for Coumadin Indication: VTE prophylaxis s/p L hip screw placement  No Known Allergies  Patient Measurements: Height: 6\' 1"  (185.4 cm) Weight: 204 lb 2.3 oz (92.6 kg) IBW/kg (Calculated) : 79.9   Vital Signs: Temp: 99.3 F (37.4 C) (01/23 1250) BP: 113/70 mmHg (01/23 1250) Pulse Rate: 106  (01/23 1250)  Labs:  Basename 07/22/11 0515 07/21/11 0635 07/19/11 2037  HGB 9.8* 10.9* --  HCT 29.8* 33.5* 33.1*  PLT 228 251 214  APTT -- -- --  LABPROT 24.5* 14.9 13.5  INR 2.16* 1.15 1.01  HEPARINUNFRC -- -- --  CREATININE 0.53 0.50 0.51  CKTOTAL -- -- --  CKMB -- -- --  TROPONINI -- -- --   Estimated Creatinine Clearance: 108.2 ml/min (by C-G formula based on Cr of 0.53).   Assessment: INR abruptly increased overnight, and now within goal range. Effective anticoagulation doubtful due to long clotting factor half-life.  H/H and plts decreased, but no overt bleeding reported. Lovenox 40mg  SQ daily continues.  Goal of Therapy:  INR 2-3   Plan:  1. Hold Coumadin tonight. 2.  F/up daily PT/INR 3. Will continue Lovenox for now, plan to d/c after at least 5 days of overlap/therapeutic INR x 48 hours.  Willie Plain K. Allena Katz, PharmD, BCPS.  Clinical Pharmacist Pager [phone]. 07/22/2011 1:20 PM

## 2011-07-23 ENCOUNTER — Encounter (HOSPITAL_COMMUNITY): Payer: Self-pay | Admitting: *Deleted

## 2011-07-23 LAB — CBC
HCT: 31.2 % — ABNORMAL LOW (ref 39.0–52.0)
MCV: 87.9 fL (ref 78.0–100.0)
RBC: 3.55 MIL/uL — ABNORMAL LOW (ref 4.22–5.81)
RDW: 13.8 % (ref 11.5–15.5)
WBC: 9.4 10*3/uL (ref 4.0–10.5)

## 2011-07-23 LAB — PROTIME-INR: INR: 1.9 — ABNORMAL HIGH (ref 0.00–1.49)

## 2011-07-23 MED ORDER — OXYCODONE HCL 5 MG PO TABS
5.0000 mg | ORAL_TABLET | Freq: Four times a day (QID) | ORAL | Status: DC | PRN
  Administered 2011-07-23 – 2011-07-24 (×4): 5 mg via ORAL
  Filled 2011-07-23 (×4): qty 1

## 2011-07-23 MED ORDER — WARFARIN SODIUM 5 MG PO TABS
5.0000 mg | ORAL_TABLET | Freq: Once | ORAL | Status: AC
  Administered 2011-07-23: 5 mg via ORAL
  Filled 2011-07-23: qty 1

## 2011-07-23 NOTE — Progress Notes (Signed)
Subjective: 3 Days Post-Op Procedure(s) (LRB): COMPRESSION HIP (Left) Patient reports pain as moderate and severe.   Left knee hurting Objective: Vital signs in last 24 hours: Temp:  [98.7 F (37.1 C)-99.3 F (37.4 C)] 98.7 F (37.1 C) (01/24 0516) Pulse Rate:  [105-123] 123  (01/24 0516) Resp:  [18-20] 18  (01/24 0516) BP: (113-123)/(64-70) 123/66 mmHg (01/24 0516) SpO2:  [95 %-97 %] 97 % (01/24 0516)  Intake/Output from previous day: 01/23 0701 - 01/24 0700 In: -  Out: 1450 [Urine:1450] Intake/Output this shift:     Basename 07/23/11 0637 07/22/11 0515 07/21/11 0635  HGB 10.1* 9.8* 10.9*    Basename 07/23/11 0637 07/22/11 0515  WBC 9.4 7.6  RBC 3.55* 3.37*  HCT 31.2* 29.8*  PLT 226 228    Basename 07/22/11 0515 07/21/11 0635  NA 137 137  K 4.2 4.2  CL 100 99  CO2 30 33*  BUN 6 5*  CREATININE 0.53 0.50  GLUCOSE 85 86  CALCIUM 8.3* 8.5    Basename 07/23/11 0637 07/22/11 0515  LABPT -- --  INR 1.90* 2.16*    Sensation intact distally Dorsiflexion/Plantar flexion intact Incision: scant drainage Dressing changed wounds clean and dry Assessment/Plan: 3 Days Post-Op Procedure(s) (LRB): COMPRESSION HIP (Left) Advance diet Up with therapy Plan for discharge tomorrow 4-1 injection to left Knee.   PATIENT:  [name]    PRE-PROCEDURE DIAGNOSIS:  Left knee OA  POST-OPERATIVE DIAGNOSIS:  Same  PROCEDURE:  Intra-articular injection left knee  PROCEDURE DETAILS:  After informed verbal consent was obtained the superolateral portal was prepped with alcohol and 4 cc of Xylocaine and 1 cc of Depo-Medrol was injected. She tolerated this well and a Band-Aid was placed.  Haskel Khan 07/23/2011, 7:40 AM

## 2011-07-23 NOTE — Plan of Care (Signed)
07/22/11 2000 pt refuses bed alarm Doren Custard

## 2011-07-23 NOTE — Progress Notes (Signed)
Clinical Social Work reviewed PT notes and recommendations along with patient wishes.  No current needs at this time for CSW Department.  If needs arise, please re-consult CSW and will be glad to address.  Otherwise, CSW signing off.  Ashley Jacobs, MSW LCSW [phone] **For Reece Levy, [place] [phone]

## 2011-07-23 NOTE — Progress Notes (Addendum)
ANTICOAGULATION CONSULT NOTE - Follow-Up Consult  Pharmacy Consult for Coumadin Indication: VTE prophylaxis s/p L hip screw placement  No Known Allergies  Patient Measurements: Height: 6\' 1"  (185.4 cm) Weight: 204 lb 2.3 oz (92.6 kg) IBW/kg (Calculated) : 79.9   Vital Signs: Temp: 98.7 F (37.1 C) (01/24 0516) BP: 123/66 mmHg (01/24 0516) Pulse Rate: 123  (01/24 0516)  Labs:  Basename 07/23/11 1610 07/22/11 0515 07/21/11 0635  HGB 10.1* 9.8* --  HCT 31.2* 29.8* 33.5*  PLT 226 228 251  APTT -- -- --  LABPROT 22.1* 24.5* 14.9  INR 1.90* 2.16* 1.15  HEPARINUNFRC -- -- --  CREATININE -- 0.53 0.50  CKTOTAL -- -- --  CKMB -- -- --  TROPONINI -- -- --   Estimated Creatinine Clearance: 108.2 ml/min (by C-G formula based on Cr of 0.53).   Assessment: INR  1.9 today (from 2.16). Abrupt increase in INR seen yesterday thus coumadin held yesterday. Today INR slightly below goal range. H/H and plts slight increase from yesterday. No overt bleeding reported. Lovenox can be discontinued since INR is > 1.8 (orders to dc when INR =/> 1.8).     Goal of Therapy:  INR 2-3   Plan:  Will DC Lovenox since INR is >1.8 per MD's initial order. If not discharged today, I will order Coumadin 5mg  today. Daily INR.  If home today, Home health nurse/pharmacist to monitor/dose outpatient coumadin for INR 2-3.  Check INR 07/24/11 per HH.  I recommend discharge dose of 5mg  daily.     Arman Filter, [place] 07/23/2011 8:42 AM  Addendum:  Patient will be discharge tomorrow 07/24/11.   Will order Coumadin 5mg  today x 1 and check INR in AM.  Arman Filter, RPh 07/23/2011 16:19 PM

## 2011-07-23 NOTE — Progress Notes (Signed)
Physical Therapy Treatment Patient Details Name: [name] MRN: [id number] DOB: 1951/01/17 Today's Date: 07/23/2011  PT Assessment/Plan  PT - Assessment/Plan Comments on Treatment Session: Pt. limited due to pain and is making slow progress.  Pt. and wife are considering pt. going for ST-SNF due to slow progress.  Do not feel pt. will be ready to DC home tomorrow based on current mobility status, plus the fact that he has 9 steps to negotiate to enter home. Agree with need for SNF for rehab due to slow progress toward goals. PT Plan: Discharge plan needs to be updated PT Frequency: Min 5X/week Follow Up Recommendations: Skilled nursing facility Equipment Recommended: Defer to next venue PT Goals  Acute Rehab PT Goals PT Goal: Supine/Side to Sit - Progress: Progressing toward goal PT Goal: Sit to Stand - Progress: Progressing toward goal PT Goal: Ambulate - Progress: Progressing toward goal  PT Treatment Precautions/Restrictions  Precautions Precautions: Fall Required Braces or Orthoses: No Restrictions Weight Bearing Restrictions: Yes LLE Weight Bearing: Weight bearing as tolerated (left LE) Mobility (including Balance) Bed Mobility Supine to Sit: 3: Mod assist Supine to Sit Details (indicate cue type and reason): slow to move, therapist needed to fully support pt's left leg due to pain  Sitting - Scoot to Edge of Bed: 4: Min assist Transfers Transfers: Yes Sit to Stand: 3: Mod assist;With upper extremity assist;From bed Sit to Stand Details (indicate cue type and reason): cues for technique and safety Stand to Sit: 4: Min assist;With armrests Ambulation/Gait Ambulation/Gait: Yes Ambulation/Gait Assistance: 4: Min assist Ambulation/Gait Assistance Details (indicate cue type and reason): cues for sequence, walker placement  and correct step length Ambulation Distance (Feet): 10 Feet Assistive device: Rolling walker Gait Pattern: Decreased step length - right Gait  velocity: slowed    Exercise    End of Session PT - End of Session Equipment Utilized During Treatment: Gait belt Activity Tolerance: Patient limited by pain Patient left: with call bell in reach;in chair;with family/visitor present (wife preseent) Nurse Communication: Mobility status for transfers;Mobility status for ambulation;Weight bearing status General Behavior During Session: [facility] for tasks performed Cognition: [facility] for tasks performed  [name] 07/23/2011, 3:34 PM Acute Rehabilitation Services [phone] (pager)

## 2011-07-24 LAB — PROTIME-INR: INR: 2.31 — ABNORMAL HIGH (ref 0.00–1.49)

## 2011-07-24 MED ORDER — WARFARIN SODIUM 2.5 MG PO TABS
2.5000 mg | ORAL_TABLET | ORAL | Status: AC
  Administered 2011-07-24: 2.5 mg via ORAL
  Filled 2011-07-24: qty 1

## 2011-07-24 NOTE — Progress Notes (Signed)
Occupational Therapy Treatment Patient Details Name: [name] MRN: [id number] DOB: Mar 08, 1951 Today's Date: 07/24/2011  OT Assessment/Plan OT Assessment/Plan Comments on Treatment Session: good participation. pt asking questions about D/C plan/[place]. directed question to case manager OT Plan: Discharge plan needs to be updated;Frequency needs to be updated OT Frequency: Min 1X/week Follow Up Recommendations: Skilled nursing facility Equipment Recommended: Defer to next venue OT Goals  see doc flowsheets  OT Treatment Precautions/Restrictions  Restrictions Weight Bearing Restrictions: Yes LLE Weight Bearing: Weight bearing as tolerated   ADL ADL Grooming: Simulated;Independent Lower Body Bathing: Set up;Supervision/safety (with AE) Where Assessed - Lower Body Bathing: Sit to stand from bed Upper Body Dressing: Set up Where Assessed - Upper Body Dressing: Sitting, chair Lower Body Dressing: Minimal assistance;Performed;Supervision/safety (with AE) Where Assessed - Lower Body Dressing: Sit to stand from bed Toilet Transfer: Performed;Supervision/safety Toilet Transfer Method: Proofreader: Bedside commode Toileting - Clothing Manipulation: Supervision/safety Where Assessed - Glass blower/designer Manipulation: Standing Toileting - Hygiene: Independent Tub/Shower Transfer: Not assessed Equipment Used: Long-handled shoe horn;Long-handled sponge;Reacher;Rolling walker;Sock aid Ambulation Related to ADLs: supervision ADL Comments:  (pt progressing toward all goals.) Mobility  Bed Mobility Bed Mobility: Yes Supine to Sit: 5: Supervision Sitting - Scoot to Edge of Bed: 5: Supervision Sit to Supine: 5: Supervision Transfers Transfers: Yes Sit to Stand: 5: Supervision Stand to Sit: 5: Supervision    End of Session OT - End of Session Equipment Utilized During Treatment: Gait belt Activity Tolerance: Patient tolerated treatment well Patient  left: in bed;with family/visitor present;with call bell in reach Nurse Communication: Mobility status for transfers General Behavior During Session: [facility] for tasks performed Cognition: [facility] for tasks performed  [facility]  07/24/2011, 4:32 PM [facility], OTR/L  [phone] 07/24/2011

## 2011-07-24 NOTE — Progress Notes (Signed)
Met with patient and his wife at bedside to f/u and answer questions they had related to d/c disposition. We have faxed FL2 to SNF and will await word from them in regards to SNF auth.- the earliest we will hear on this is Monday. I have spoken with them about the SNF process and the fact that until we hear from [facility] we will not be able to move to SNF- as well, if we do not get authorization from [facility] (which could be the case based on progression with PT/OT) he will not be covered for SNF stay. We have discussed home option as well- HH, DME, etc. He might need and EMS transport home given there are 9 steps to get into home (the home is one level). Will ask weekend CSW to f/u to further assist with questions and d/c planning. Reece Levy, MSW, Theresia Majors [phone]

## 2011-07-24 NOTE — Progress Notes (Signed)
Admit Complaint: left hip pain; s/p L hip screw placement  Anticoagulation: INR  2.31 today   H/H and plts slight increase from yesterday. No overt bleeding reported. Lovenox can be discontinued since INR is > 1.8 (orders to dc when INR =/> 1.8).     Plan off lovenox; coumadin 2.5mg  po x1 before d/c and further management by Home Health

## 2011-07-24 NOTE — Progress Notes (Signed)
Physical Therapy Treatment Patient Details Name: [name] MRN: [id number] DOB: 08-06-1950 Today's Date: 07/24/2011  PT Assessment/Plan  PT - Assessment/Plan Comments on Treatment Session: Pt.with improvement in tolerance to ambulation and  pain appeared better controlled.  Awaiting response from [facility] on whether he is approved for ST-SNF. PT Plan: Discharge plan remains appropriate PT Frequency: Min 5X/week Follow Up Recommendations: Skilled nursing facility Equipment Recommended: Defer to next venue PT Goals  Acute Rehab PT Goals PT Goal: Sit to Stand - Progress: Progressing toward goal PT Goal: Ambulate - Progress: Progressing toward goal  PT Treatment Precautions/Restrictions  Precautions Precautions: Fall Required Braces or Orthoses: No Restrictions Weight Bearing Restrictions: Yes LLE Weight Bearing: Weight bearing as tolerated Mobility (including Balance) Bed Mobility Bed Mobility: No (pt. up in recliner) Transfers Transfers: Yes Sit to Stand: 4: Min assist;With upper extremity assist;From chair/3-in-1 Sit to Stand Details (indicate cue type and reason): cues for hand placement and to scoot to edge of recliner Stand to Sit: 4: Min assist Stand to Sit Details: to control descent Ambulation/Gait Ambulation/Gait: Yes Ambulation/Gait Assistance: 4: Min assist Ambulation/Gait Assistance Details (indicate cue type and reason): cues for correct foot placement/step length inside RW Ambulation Distance (Feet): 80 Feet Assistive device: Rolling walker Gait Pattern: Step-to pattern Gait velocity: improved form yerterday    Exercise    End of Session PT - End of Session Equipment Utilized During Treatment: Gait belt Activity Tolerance: Patient tolerated treatment well;Patient limited by pain Patient left: in chair;with call bell in reach;with family/visitor present Nurse Communication: Mobility status for transfers;Mobility status for ambulation;Weight bearing  status General Behavior During Session: [facility] for tasks performed (pt. working from phone before PT and after. Focused on work.) Cognition: WFL for tasks performed  [name] 07/24/2011, 12:27 PM Acute Rehabilitation Services [phone] (pager)

## 2011-07-24 NOTE — Discharge Summary (Deleted)
Physician Discharge Summary  Patient ID: [name] MRN: [id number] DOB/AGE: 63-02-1951 63 y.o.  Admit date: 07/19/2011 Discharge date: 07/24/2011  Admission Diagnoses:  Closed intertrochanteric fracture of left hip  Discharge Diagnoses:  Principal Problem:  *Closed intertrochanteric fracture of left hip Active Problems:  S/P gastric bypass   Past Medical History  Diagnosis Date  . 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 Left patella fracture 07/19/2011  . Arthritis   . Closed intertrochanteric fracture of left hip 07/20/2011    Surgeries: Procedure(s): COMPRESSION HIP on 07/19/2011 - 07/20/2011   Consultants (if any):    Discharged Condition: Improved  Hospital Course: [name] is an 61 y.o. male who was admitted 07/19/2011 with a diagnosis of Closed intertrochanteric fracture of left hip and went to the operating room on 07/19/2011 - 07/20/2011 and underwent the above named procedures.    He was given perioperative antibiotics:  Anti-infectives     Start     Dose/Rate Route Frequency Ordered Stop   07/20/11 2300   ceFAZolin (ANCEF) IVPB 2 g/50 mL premix        2 g 100 mL/hr over 30 Minutes Intravenous Every 6 hours 07/20/11 2015 07/21/11 1313        .  He was given sequential compression devices, early ambulation, and chemoprophylaxis for DVT prophylaxis.  He benefited maximally from their hospital stay and there were no complications.  He was unable to regain independent ambulatory function, and will need skilled nursing facility for at least 2-3 weeks  Recent vital signs:  Filed Vitals:   07/24/11 0526  BP: 111/69  Pulse: 84  Temp: 97.7 F (36.5 C)  Resp: 18    Recent laboratory studies:  Lab Results  Component Value Date   HGB 10.1* 07/23/2011   HGB 9.8* 07/22/2011   HGB 10.9* 07/21/2011   Lab Results  Component Value Date   WBC 9.4 07/23/2011   PLT 226 07/23/2011   Lab Results  Component Value Date   INR 2.31* 07/24/2011   Lab Results  Component  Value Date   NA 137 07/22/2011   K 4.2 07/22/2011   CL 100 07/22/2011   CO2 30 07/22/2011   BUN 6 07/22/2011   CREATININE 0.53 07/22/2011   GLUCOSE 85 07/22/2011    Discharge Medications:   Medication List  As of 07/24/2011  9:07 AM   STOP taking these medications         Acetaminophen 650 MG Tabs      HYDROcodone-acetaminophen 10-325 MG per tablet         TAKE these medications         CALCIUM 600 + D PO   Take 1 tablet by mouth daily.      enoxaparin 40 MG/0.4ML Soln   Commonly known as: LOVENOX   Inject 0.4 mLs (40 mg total) into the skin daily.      gabapentin 300 MG capsule   Commonly known as: NEURONTIN   Take 600 mg by mouth 2 (two) times daily.      methocarbamol 500 MG tablet   Commonly known as: ROBAXIN   Take 1 tablet (500 mg total) by mouth 4 (four) times daily.      OSTEO BI-FLEX ADV DOUBLE ST Caps   Take 1 capsule by mouth daily.      oxyCODONE-acetaminophen 10-325 MG per tablet   Commonly known as: PERCOCET   Take 1-2 tablets by mouth every 6 (six) hours as needed for pain. MAXIMUM TOTAL  ACETAMINOPHEN DOSE IS 4000 MG PER DAY      traMADol 50 MG tablet   Commonly known as: ULTRAM   Take 50 mg by mouth every 6 (six) hours as needed. For pain      warfarin 5 MG tablet   Commonly known as: COUMADIN   Take 1 tablet (5 mg total) by mouth daily.            Diagnostic Studies: Dg Hip Operative Left  07/20/2011  *RADIOLOGY REPORT*  Clinical Data: Fracture fixation.  OPERATIVE LEFT HIP  Comparison: MRI left hip 07/20/2011.  Findings: We are provided with four fluoroscopic spot views of the left hip.  Images demonstrate placement of a dynamic hip screw for fixation of a nondisplaced intertrochanteric fracture.  IMPRESSION: ORIF left intertrochanteric fracture without evidence of complication.  Original Report Authenticated By: Bernadene Bell. Maricela Curet, M.D.   Dg Pelvis Portable  07/20/2011  *RADIOLOGY REPORT*  Clinical Data: Status post fracture fixation.  PORTABLE  PELVIS  Comparison: MRI left hip 07/20/2011.  Findings: The patient has a new dynamic hip screw for fixation of an intertrochanteric left femur fracture.  Hardware is intact.  No new abnormality.  IMPRESSION: ORIF left intertrochanteric fracture without evidence of complication.  Original Report Authenticated By: Bernadene Bell. Maricela Curet, M.D.   Dg Pelvis Portable  07/19/2011  *RADIOLOGY REPORT*  Clinical Data: Status post fall 1 week ago.  Left hip pain.  PORTABLE PELVIS  Comparison: Plain films left hip 07/18/2009.  Findings: There is a lucency in the greater trochanter worrisome for a nondisplaced fracture.  Left hip is located.  Except as noted, no evidence of fracture is seen.  IMPRESSION: Findings highly suspicious for a nondisplaced left intertrochanteric fracture.  Original Report Authenticated By: Bernadene Bell. Maricela Curet, M.D.   Mr Hip Left Wo Contrast  07/20/2011  *RADIOLOGY REPORT*  Clinical Data: Larey Seat.  Persistent left hip pain.  MRI OF THE LEFT HIP WITHOUT CONTRAST  Technique:  Multiplanar, multisequence MR imaging was performed. No intravenous contrast was administered.  Comparison: Left hip radiographs 07/19/2011  Findings: There is a nondisplaced intertrochanteric fracture of the left hip.  There is significant surrounding edema, fluid and hemorrhage in and around the hip and pelvic musculature.  A small left hip joint effusion is noted.  The pubic symphysis and SI joints are intact.  No pelvic fractures are identified.  No significant intrapelvic abnormalities.  No inguinal mass or hernia.  IMPRESSION:  1.  Nondisplaced intertrochanteric fracture of the left hip. 2.  Edema, fluid and hemorrhage in the surrounding left hip and pelvic musculature. 3.  No pelvic fractures.  Original Report Authenticated By: P. Loralie Champagne, M.D.   Chest Portable 1 View  07/19/2011  *RADIOLOGY REPORT*  Clinical Data: Preoperative respiratory film.  PORTABLE CHEST - 1 VIEW  Comparison: None.  Findings: Lungs are clear.   Heart size is upper normal.  No pneumothorax or effusion.  No focal bony abnormality.  IMPRESSION: No acute disease.  Original Report Authenticated By: Bernadene Bell. D'ALESSIO, M.D.   Dg Hip Portable 1 View Left  07/20/2011  *RADIOLOGY REPORT*  Clinical Data: Fracture fixation.  PORTABLE LEFT HIP - 1 VIEW  Comparison: MRI left hip 07/20/2011.  Findings: Dynamic hip screw for fixation of a left intertrochanteric fracture is in place.  Hardware appears intact. Distal end of the plate is just off the margin of the film.  No new abnormality.  IMPRESSION: ORIF left intertrochanteric fracture.  Original Report Authenticated By: Bernadene Bell. D'ALESSIO,  M.D.   Dg Hip Portable 1 View Left  07/19/2011  *RADIOLOGY REPORT*  Clinical Data: Fall, pain.  PORTABLE LEFT HIP - 1 VIEW  Comparison: None.  Findings: There is a subtle cortical disruption seen on the frog- leg view worrisome for nondisplaced fracture.  IMPRESSION: Findings suspicious for left intertrochanteric fracture.  Original Report Authenticated By: Bernadene Bell. Maricela Curet, M.D.    Disposition:   Discharge Orders    Future Orders Please Complete By Expires   Diet general      Call MD / Call 911      Comments:   If you experience chest pain or shortness of breath, CALL 911 and be transported to the hospital emergency room.  If you develope a fever above 101 F, pus (white drainage) or increased drainage or redness at the wound, or calf pain, call your surgeon's office.   Constipation Prevention      Comments:   Drink plenty of fluids.  Prune juice may be helpful.  You may use a stool softener, such as Colace (over the counter) 100 mg twice a day.  Use MiraLax (over the counter) for constipation as needed.   Increase activity slowly as tolerated      Weight Bearing as taught in Physical Therapy      Comments:   Use a walker or crutches as instructed.   Discharge wound care:      Comments:   If you have a hip bandage, keep it clean and dry.  Change your  bandage as instructed by your health care providers.  If your bandage has been discontinued, keep your incision clean and dry.  Pat dry after bathing.  DO NOT put lotion or powder on your incision.      Follow-up Information    Follow up with Fianna Snowball P, MD in 2 weeks.   Contact information:   [facility] [address], [address] [place] [address] [phone]           Signed: Eulas Post 07/24/2011, 9:07 AM

## 2011-07-24 NOTE — Progress Notes (Signed)
Procedure(s) (LRB): COMPRESSION HIP (Left) 4 Days Post-Op   Subjective:  Patient reports pain as moderate. He has not been able to regain independent ambulatory function. He is very upset because he has a big event next week that it does not appear like he is functional enough to be able to attend and probably.  Objective:   VITALS:  BP 111/69  Pulse 84  Temp(Src) 97.7 F (36.5 C) (Oral)  Resp 18  Ht 6\' 1"  (1.854 m)  Wt 92.6 kg (204 lb 2.3 oz)  BMI 26.93 kg/m2  SpO2 96%  His dressings are clean. EHL and FHL are firing.  LABS Lab Results  Component Value Date   HGB 10.1* 07/23/2011   HGB 9.8* 07/22/2011   HGB 10.9* 07/21/2011   Lab Results  Component Value Date   WBC 9.4 07/23/2011   PLT 226 07/23/2011   Lab Results  Component Value Date   INR 2.31* 07/24/2011   Lab Results  Component Value Date   NA 137 07/22/2011   K 4.2 07/22/2011   CL 100 07/22/2011   CO2 30 07/22/2011   BUN 6 07/22/2011   CREATININE 0.53 07/22/2011   GLUCOSE 85 07/22/2011    Assessment/Plan: Principal Problem:  *Closed intertrochanteric fracture of left hip Active Problems:  S/P gastric bypass   He does not appear to be regaining independence adequate to be able to be discharged home. He is going to need admission to skilled nursing for at least 2-3 weeks in order to regain independent ambulatory function. We will begin to range for this, and from a medical standpoint he is okay to be discharged when a bed is available.   Ahana Najera P 07/24/2011, 9:05 AM

## 2011-07-24 NOTE — Progress Notes (Signed)
CARE MANAGEMENT NOTE 07/24/2011   Case Manager will follow to see if pt gets approved for SNF for shortterm rehab.

## 2011-07-24 NOTE — Progress Notes (Signed)
Clinical Social Work:  Received consult for change in disposition and to SNF. CSW completed full assessment and placed in shadow chart.  Patient and wife agreeable and want [facility].  Began referral to [facility] with [facility].  Sent all clinicals through [facility].    FL2 in chart. Please sign.  Awaiting clinicals reviewed and authorization from [facility].  Ashley Jacobs, MSW LCSW [phone]  Will follow and assist with needs.

## 2011-07-25 LAB — PROTIME-INR
INR: 2.54 — ABNORMAL HIGH (ref 0.00–1.49)
Prothrombin Time: 27.8 seconds — ABNORMAL HIGH (ref 11.6–15.2)

## 2011-07-25 MED ORDER — WARFARIN SODIUM 2.5 MG PO TABS
2.5000 mg | ORAL_TABLET | ORAL | Status: AC
  Administered 2011-07-25: 2.5 mg via ORAL
  Filled 2011-07-25: qty 1

## 2011-07-25 NOTE — Progress Notes (Signed)
Lovenox ed. and handout reviewed. Self-injection ed. Complete w/ demonstration. No questions.

## 2011-07-25 NOTE — Discharge Summary (Signed)
Physician Discharge Summary  Patient ID: CHISOM AUST MRN: [id number] DOB/AGE: 1951-04-02 61 y.o.  Admit date: 07/19/2011 Discharge date: 07/25/2011  Admission Diagnoses:  Closed intertrochanteric fracture of left hip  Discharge Diagnoses:  Principal Problem:  *Closed intertrochanteric fracture of left hip Active Problems:  S/P gastric bypass   Past Medical History  Diagnosis Date  . 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 Left patella fracture 07/19/2011  . Arthritis   . Closed intertrochanteric fracture of left hip 07/20/2011    Surgeries: Procedure(s): COMPRESSION HIP on 07/19/2011 - 07/20/2011   Consultants (if any):    Discharged Condition: Improved  Hospital Course: LIN GLAZIER is an 61 y.o. male who was admitted 07/19/2011 with a diagnosis of Closed intertrochanteric fracture of left hip and went to the operating room on 07/19/2011 - 07/20/2011 and underwent the above named procedures.    He was given perioperative antibiotics:  Anti-infectives     Start     Dose/Rate Route Frequency Ordered Stop   07/20/11 2300   ceFAZolin (ANCEF) IVPB 2 g/50 mL premix        2 g 100 mL/hr over 30 Minutes Intravenous Every 6 hours 07/20/11 2015 07/21/11 1313        .  He was given sequential compression devices, early ambulation, and chemoprophylaxis for DVT prophylaxis.  He benefited maximally from their hospital stay and there were no complications.    Recent vital signs:  Filed Vitals:   07/25/11 0539  BP: 121/70  Pulse: 87  Temp: 98.6 F (37 C)  Resp: 18    Recent laboratory studies:  Lab Results  Component Value Date   HGB 10.1* 07/23/2011   HGB 9.8* 07/22/2011   HGB 10.9* 07/21/2011   Lab Results  Component Value Date   WBC 9.4 07/23/2011   PLT 226 07/23/2011   Lab Results  Component Value Date   INR 2.54* 07/25/2011   Lab Results  Component Value Date   NA 137 07/22/2011   K 4.2 07/22/2011   CL 100 07/22/2011   CO2 30 07/22/2011   BUN 6 07/22/2011   CREATININE 0.53 07/22/2011   GLUCOSE 85 07/22/2011    Discharge Medications:   Medication List  As of 07/25/2011 11:35 AM   STOP taking these medications         Acetaminophen 650 MG Tabs      HYDROcodone-acetaminophen 10-325 MG per tablet         TAKE these medications         CALCIUM 600 + D PO   Take 1 tablet by mouth daily.      enoxaparin 40 MG/0.4ML Soln   Commonly known as: LOVENOX   Inject 0.4 mLs (40 mg total) into the skin daily.      gabapentin 300 MG capsule   Commonly known as: NEURONTIN   Take 600 mg by mouth 2 (two) times daily.      methocarbamol 500 MG tablet   Commonly known as: ROBAXIN   Take 1 tablet (500 mg total) by mouth 4 (four) times daily.      OSTEO BI-FLEX ADV DOUBLE ST Caps   Take 1 capsule by mouth daily.      oxyCODONE-acetaminophen 10-325 MG per tablet   Commonly known as: PERCOCET   Take 1-2 tablets by mouth every 6 (six) hours as needed for pain. MAXIMUM TOTAL ACETAMINOPHEN DOSE IS 4000 MG PER DAY      traMADol 50 MG tablet   Commonly known  as: ULTRAM   Take 50 mg by mouth every 6 (six) hours as needed. For pain      warfarin 5 MG tablet   Commonly known as: COUMADIN   Take 1 tablet (5 mg total) by mouth daily.            Diagnostic Studies: Dg Hip Operative Left  07/20/2011  *RADIOLOGY REPORT*  Clinical Data: Fracture fixation.  OPERATIVE LEFT HIP  Comparison: MRI left hip 07/20/2011.  Findings: We are provided with four fluoroscopic spot views of the left hip.  Images demonstrate placement of a dynamic hip screw for fixation of a nondisplaced intertrochanteric fracture.  IMPRESSION: ORIF left intertrochanteric fracture without evidence of complication.  Original Report Authenticated By: Bernadene Bell. Maricela Curet, M.D.   Dg Pelvis Portable  07/20/2011  *RADIOLOGY REPORT*  Clinical Data: Status post fracture fixation.  PORTABLE PELVIS  Comparison: MRI left hip 07/20/2011.  Findings: The patient has a new dynamic hip screw for fixation of  an intertrochanteric left femur fracture.  Hardware is intact.  No new abnormality.  IMPRESSION: ORIF left intertrochanteric fracture without evidence of complication.  Original Report Authenticated By: Bernadene Bell. Maricela Curet, M.D.   Dg Pelvis Portable  07/19/2011  *RADIOLOGY REPORT*  Clinical Data: Status post fall 1 week ago.  Left hip pain.  PORTABLE PELVIS  Comparison: Plain films left hip 07/18/2009.  Findings: There is a lucency in the greater trochanter worrisome for a nondisplaced fracture.  Left hip is located.  Except as noted, no evidence of fracture is seen.  IMPRESSION: Findings highly suspicious for a nondisplaced left intertrochanteric fracture.  Original Report Authenticated By: Bernadene Bell. Maricela Curet, M.D.   Mr Hip Left Wo Contrast  07/20/2011  *RADIOLOGY REPORT*  Clinical Data: Larey Seat.  Persistent left hip pain.  MRI OF THE LEFT HIP WITHOUT CONTRAST  Technique:  Multiplanar, multisequence MR imaging was performed. No intravenous contrast was administered.  Comparison: Left hip radiographs 07/19/2011  Findings: There is a nondisplaced intertrochanteric fracture of the left hip.  There is significant surrounding edema, fluid and hemorrhage in and around the hip and pelvic musculature.  A small left hip joint effusion is noted.  The pubic symphysis and SI joints are intact.  No pelvic fractures are identified.  No significant intrapelvic abnormalities.  No inguinal mass or hernia.  IMPRESSION:  1.  Nondisplaced intertrochanteric fracture of the left hip. 2.  Edema, fluid and hemorrhage in the surrounding left hip and pelvic musculature. 3.  No pelvic fractures.  Original Report Authenticated By: P. Loralie Champagne, M.D.   Chest Portable 1 View  07/19/2011  *RADIOLOGY REPORT*  Clinical Data: Preoperative respiratory film.  PORTABLE CHEST - 1 VIEW  Comparison: None.  Findings: Lungs are clear.  Heart size is upper normal.  No pneumothorax or effusion.  No focal bony abnormality.  IMPRESSION: No acute  disease.  Original Report Authenticated By: Bernadene Bell. D'ALESSIO, M.D.   Dg Hip Portable 1 View Left  07/20/2011  *RADIOLOGY REPORT*  Clinical Data: Fracture fixation.  PORTABLE LEFT HIP - 1 VIEW  Comparison: MRI left hip 07/20/2011.  Findings: Dynamic hip screw for fixation of a left intertrochanteric fracture is in place.  Hardware appears intact. Distal end of the plate is just off the margin of the film.  No new abnormality.  IMPRESSION: ORIF left intertrochanteric fracture.  Original Report Authenticated By: Bernadene Bell. D'ALESSIO, M.D.   Dg Hip Portable 1 View Left  07/19/2011  *RADIOLOGY REPORT*  Clinical Data: Fall, pain.  PORTABLE LEFT HIP - 1 VIEW  Comparison: None.  Findings: There is a subtle cortical disruption seen on the frog- leg view worrisome for nondisplaced fracture.  IMPRESSION: Findings suspicious for left intertrochanteric fracture.  Original Report Authenticated By: Bernadene Bell. Maricela Curet, M.D.    Disposition:   Discharge Orders    Future Orders Please Complete By Expires   Diet general      Call MD / Call 911      Comments:   If you experience chest pain or shortness of breath, CALL 911 and be transported to the hospital emergency room.  If you develope a fever above 101 F, pus (white drainage) or increased drainage or redness at the wound, or calf pain, call your surgeon's office.   Constipation Prevention      Comments:   Drink plenty of fluids.  Prune juice may be helpful.  You may use a stool softener, such as Colace (over the counter) 100 mg twice a day.  Use MiraLax (over the counter) for constipation as needed.   Increase activity slowly as tolerated      Weight Bearing as taught in Physical Therapy      Comments:   Use a walker or crutches as instructed.   Discharge wound care:      Comments:   If you have a hip bandage, keep it clean and dry.  Change your bandage as instructed by your health care providers.  If your bandage has been discontinued, keep your  incision clean and dry.  Pat dry after bathing.  DO NOT put lotion or powder on your incision.      Follow-up Information    Follow up with Dagmar Adcox P, MD in 2 weeks.   Contact information:   [facility] [address] [place] [address] [phone]           Signed: Eulas Post 07/25/2011, 11:35 AM

## 2011-07-25 NOTE — Progress Notes (Signed)
Admit Complaint: left hip pain; s/p L hip screw placement  Anticoagulation: INR  2.54 today  No overt bleeding reported. off lovenox  Plan 1) coumadin 2.5mg  po x1 before d/c and further management by Home Health

## 2011-07-25 NOTE — Progress Notes (Signed)
Subjective: 5 Days Post-Op Procedure(s) (LRB): COMPRESSION HIP (Left) Patient reports pain as mild and moderate.  He has been nail to ambulate around the nursing station, and has turned a corner and feels great. He wants to go home.  Objective: Vital signs in last 24 hours: Temp:  [97.9 F (36.6 C)-98.7 F (37.1 C)] 98.6 F (37 C) (01/26 0539) Pulse Rate:  [86-93] 87  (01/26 0539) Resp:  [18] 18  (01/26 0539) BP: (121-131)/(66-71) 121/70 mmHg (01/26 0539) SpO2:  [97 %-99 %] 97 % (01/26 0539)  Intake/Output from previous day: 01/25 0701 - 01/26 0700 In: -  Out: 500 [Urine:500] Intake/Output this shift:     Basename 07/23/11 0637  HGB 10.1*    Basename 07/23/11 0637  WBC 9.4  RBC 3.55*  HCT 31.2*  PLT 226   No results found for this basename: NA:2,K:2,CL:2,CO2:2,BUN:2,CREATININE:2,GLUCOSE:2,CALCIUM:2 in the last 72 hours  Basename 07/25/11 0615 07/24/11 0548  LABPT -- --  INR 2.54* 2.31*    Neurologically intact ABD soft Sensation intact distally Dorsiflexion/Plantar flexion intact Incision: dressing C/D/I and no drainage  Assessment/Plan: 5 Days Post-Op Procedure(s) (LRB): COMPRESSION HIP (Left) Advance diet Up with therapy Plan for discharge home today.  Haskel Khan 07/25/2011, 10:35 AM

## 2011-07-25 NOTE — Progress Notes (Signed)
  CARE MANAGEMENT NOTE 07/25/2011  Received order requesting DME for patient, spoke with him and was informed that he already has a rolling walker and 3in1, needs wheelchair with large wheels and elevating leg rests. Entered request in [facility], asked that it be delivered to his home, patient being discharged via ambulance.

## 2011-07-25 NOTE — Progress Notes (Signed)
PT Cancellation Note Chart reviewed, noted change in discharge plan.  Patient to discharge home today. Spoke with patient re: 9 steps to enter house.  Patient being discharged by ambulance to home.  Instructed patient not to try stairs until HHPT instructed patient in safe/proper technique.  Patient declined PT session - had just returned to room from ambulation with nursing.  Patient has all equipment needed - at home (RW, [facility], tub seat, long-handled sponge, etc.) HHPT being arranged for follow-up therapy to continue with current goals.  No other needs identified.  Patient to discharge home today.  [name] Renaldo Fiddler, [facility]hab Services Pager [phone]

## 2012-07-07 ENCOUNTER — Encounter: Payer: Self-pay | Admitting: Gastroenterology

## 2012-11-11 ENCOUNTER — Emergency Department (HOSPITAL_BASED_OUTPATIENT_CLINIC_OR_DEPARTMENT_OTHER)
Admission: EM | Admit: 2012-11-11 | Discharge: 2012-11-11 | Disposition: A | Discharge status: Home / Self Care | Payer: BC Managed Care – PPO | Attending: Emergency Medicine | Admitting: Emergency Medicine

## 2012-11-11 ENCOUNTER — Encounter (HOSPITAL_BASED_OUTPATIENT_CLINIC_OR_DEPARTMENT_OTHER): Payer: Self-pay | Admitting: *Deleted

## 2012-11-11 ENCOUNTER — Emergency Department (HOSPITAL_BASED_OUTPATIENT_CLINIC_OR_DEPARTMENT_OTHER): Payer: BC Managed Care – PPO

## 2012-11-11 DIAGNOSIS — Z862 Personal history of diseases of the blood and blood-forming organs and certain disorders involving the immune mechanism: Secondary | ICD-10-CM | POA: Insufficient documentation

## 2012-11-11 DIAGNOSIS — Z8781 Personal history of (healed) traumatic fracture: Secondary | ICD-10-CM | POA: Insufficient documentation

## 2012-11-11 DIAGNOSIS — M129 Arthropathy, unspecified: Secondary | ICD-10-CM | POA: Insufficient documentation

## 2012-11-11 DIAGNOSIS — Z79899 Other long term (current) drug therapy: Secondary | ICD-10-CM | POA: Insufficient documentation

## 2012-11-11 DIAGNOSIS — M79609 Pain in unspecified limb: Secondary | ICD-10-CM | POA: Insufficient documentation

## 2012-11-11 DIAGNOSIS — M7989 Other specified soft tissue disorders: Secondary | ICD-10-CM | POA: Insufficient documentation

## 2012-11-11 DIAGNOSIS — Z9884 Bariatric surgery status: Secondary | ICD-10-CM | POA: Insufficient documentation

## 2012-11-11 DIAGNOSIS — M79641 Pain in right hand: Secondary | ICD-10-CM

## 2012-11-11 DIAGNOSIS — Z8639 Personal history of other endocrine, nutritional and metabolic disease: Secondary | ICD-10-CM | POA: Insufficient documentation

## 2012-11-11 HISTORY — DX: Gout, unspecified: M10.9

## 2012-11-11 LAB — CBC WITH DIFFERENTIAL/PLATELET
Basophils Relative: 0 % (ref 0–1)
Eosinophils Absolute: 0.4 10*3/uL (ref 0.0–0.7)
Hemoglobin: 13.3 g/dL (ref 13.0–17.0)
Lymphs Abs: 2.7 10*3/uL (ref 0.7–4.0)
MCH: 30.4 pg (ref 26.0–34.0)
MCHC: 33.4 g/dL (ref 30.0–36.0)
Neutro Abs: 5.2 10*3/uL (ref 1.7–7.7)
Neutrophils Relative %: 55 % (ref 43–77)
Platelets: 212 10*3/uL (ref 150–400)
RBC: 4.37 MIL/uL (ref 4.22–5.81)

## 2012-11-11 LAB — BASIC METABOLIC PANEL
Chloride: 103 mEq/L (ref 96–112)
GFR calc Af Amer: >90 mL/min (ref >90)
GFR calc non Af Amer: >90 mL/min (ref >90)
Potassium: 3.7 mEq/L (ref 3.5–5.1)
Sodium: 141 mEq/L (ref 135–145)

## 2012-11-11 MED ORDER — ONDANSETRON 8 MG PO TBDP: 8.0000 mg | ORAL_TABLET | Freq: Three times a day (TID) | ORAL | Status: DC | PRN

## 2012-11-11 MED ORDER — ONDANSETRON 8 MG PO TBDP
8.0000 mg | ORAL_TABLET | Freq: Once | ORAL | Status: AC
  Administered 2012-11-11: 8 mg via ORAL

## 2012-11-11 MED ORDER — OXYCODONE-ACETAMINOPHEN 5-325 MG PO TABS
2.0000 | ORAL_TABLET | Freq: Once | ORAL | Status: AC
  Administered 2012-11-11: 2 via ORAL
  Filled 2012-11-11 (×2): qty 2

## 2012-11-11 MED ORDER — OXYCODONE-ACETAMINOPHEN 5-325 MG PO TABS: 1.0000 | ORAL_TABLET | ORAL | Status: DC | PRN

## 2012-11-11 MED ORDER — ONDANSETRON 8 MG PO TBDP
ORAL_TABLET | ORAL | Status: AC
  Administered 2012-11-11: 8 mg via ORAL
  Filled 2012-11-11: qty 1

## 2012-11-11 MED ORDER — IBUPROFEN 600 MG PO TABS: 600.0000 mg | ORAL_TABLET | Freq: Four times a day (QID) | ORAL | Status: DC | PRN

## 2012-11-11 NOTE — ED Notes (Signed)
Pt reports vomiting today. Pt has hx of gastric bypass and has vomiting and nausea when diet is not adhered. Pt states he did eat greasy meal this am and believes vomiting is a result.

## 2012-11-11 NOTE — ED Notes (Signed)
MD at bedside. 

## 2012-11-11 NOTE — ED Provider Notes (Addendum)
History     CSN: [id number]  Arrival date & time 11/11/12  4098   First MD Initiated Contact with Patient 11/11/12 2030      Chief Complaint  Patient presents with  . Hand Pain    (Consider location/radiation/quality/duration/timing/severity/associated sxs/prior treatment) HPI Patient with right hand pain and swelling that was noted on awakening today. He has no known history of trauma. They pain is on the dorsal aspect of the right hand with out radiation. It is mildly warm he denies any fever or spreading redness. There has not been any rapid involvement today. He has a history of gout but has experienced this only in his feet. He is not currently taking anything for this. He has not had fever or chills. Past Medical History  Diagnosis Date  . 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 Left patella fracture 07/19/2011  . Arthritis   . Closed intertrochanteric fracture of left hip 07/20/2011  . Gout     Past Surgical History  Procedure Laterality Date  . Roux-en-y procedure  2008    carpal tunnell  l rcr  . Compression hip screw  07/20/2011    Procedure: COMPRESSION HIP;  Surgeon: Eulas Post, MD;  Location: [facility] OR;  Service: Orthopedics;  Laterality: Left;    Family History  Problem Relation Age of Onset  . Heart disease Father     Died in his 29s of a heart attack    History  Substance Use Topics  . Smoking status: Never Smoker   . Smokeless tobacco: Not on file  . Alcohol Use: 0.6 oz/week    1 Shots of liquor per week      Review of Systems  All other systems reviewed and are negative.    Allergies  Review of patient's allergies indicates no known allergies.  Home Medications   Current Outpatient Rx  Name  Route  Sig  Dispense  Refill  . Calcium Carbonate-Vitamin D (CALCIUM 600 + D PO)   Oral   Take 1 tablet by mouth daily.         Marland Kitchen gabapentin (NEURONTIN) 300 MG capsule   Oral   Take 600 mg by mouth 2 (two) times daily.         . Misc Natural  Products (OSTEO BI-FLEX ADV DOUBLE ST) CAPS   Oral   Take 1 capsule by mouth daily.         . traMADol (ULTRAM) 50 MG tablet   Oral   Take 50 mg by mouth every 6 (six) hours as needed. For pain         . EXPIRED: warfarin (COUMADIN) 5 MG tablet   Oral   Take 1 tablet (5 mg total) by mouth daily.   30 tablet   1     Exact dose to be managed based on protocol by Home ...     BP 141/86  Pulse 81  Temp(Src) 98.8 F (37.1 C) (Oral)  Resp 16  Ht 6\' 1"  (1.854 m)  Wt 188 lb (85.276 kg)  BMI 24.81 kg/m2  SpO2 99%  Physical Exam  Nursing note and vitals reviewed. Constitutional: He is oriented to person, place, and time. He appears well-developed and well-nourished.  HENT:  Head: Normocephalic.  Right Ear: External ear normal.  Left Ear: External ear normal.  Nose: Nose normal.  Mouth/Throat: Oropharynx is clear and moist.  Eyes: Pupils are equal, round, and reactive to light.  Neck: Normal range of motion.  Cardiovascular: Normal  rate and regular rhythm.   Pulmonary/Chest: Effort normal and breath sounds normal.  Abdominal: Soft. Bowel sounds are normal.  Musculoskeletal:  Mild swelling dorsal aspect right hand lightly warm. No lymphadenopathy in axilla. Full active range of motion of her wrist, calm, and all fingers. Fingers are pink capillary refill is less than 2 seconds and sensation is intact. There is no sign of trauma to the skin.  Neurological: He is alert and oriented to person, place, and time.  Skin: Skin is warm and dry.  Psychiatric: He has a normal mood and affect. His behavior is normal. Thought content normal.    ED Course  Procedures (including critical care time)  Labs Reviewed  CBC WITH DIFFERENTIAL - Abnormal; Notable for the following:    Monocytes Absolute 1.1 (*)    All other components within normal limits  BASIC METABOLIC PANEL   Dg Hand Complete Right  11/11/2012   *RADIOLOGY REPORT*  Clinical Data: Right hand pain.  Redness and swelling  over the first and second metacarpals.  RIGHT HAND - COMPLETE 3+ VIEW  Comparison: None.  Findings: A very thin radiopaque curvilinear density measuring 5 mm in greatest length projects over the soft tissues of the second digit, adjacent to the middle phalanx.  The bones are osteopenic in appearance.  The joints are aligned. Mild osteoarthritis affects the distal interphalangeal joints of all five digits, and there is mild osteoarthritis of the proximal interphalangeal joints of the fourth and fifth digits.  No acute or healing fracture is identified.  No discrete focal soft tissue swelling is appreciated radiographically.  IMPRESSION:  1.  Osteoarthritis. 2.  No acute bony abnormality. 3.  Tiny radiopaque foreign body in the soft tissues of the second digit as described above.   Original Report Authenticated By: Britta Mccreedy, M.D.     No diagnosis found.    MDM    Results for orders placed during the hospital encounter of 11/11/12  CBC WITH DIFFERENTIAL      Result Value Range   WBC 9.5  4.0 - 10.5 K/uL   RBC 4.37  4.22 - 5.81 MIL/uL   Hemoglobin 13.3  13.0 - 17.0 g/dL   HCT 47.8  29.5 - 62.1 %   MCV 91.1  78.0 - 100.0 fL   MCH 30.4  26.0 - 34.0 pg   MCHC 33.4  30.0 - 36.0 g/dL   RDW 30.8  65.7 - 84.6 %   Platelets 212  150 - 400 K/uL   Neutrophils Relative % 55  43 - 77 %   Neutro Abs 5.2  1.7 - 7.7 K/uL   Lymphocytes Relative 29  12 - 46 %   Lymphs Abs 2.7  0.7 - 4.0 K/uL   Monocytes Relative 11  3 - 12 %   Monocytes Absolute 1.1 (*) 0.1 - 1.0 K/uL   Eosinophils Relative 4  0 - 5 %   Eosinophils Absolute 0.4  0.0 - 0.7 K/uL   Basophils Relative 0  0 - 1 %   Basophils Absolute 0.0  0.0 - 0.1 K/uL  BASIC METABOLIC PANEL      Result Value Range   Sodium 141  135 - 145 mEq/L   Potassium 3.7  3.5 - 5.1 mEq/L   Chloride 103  96 - 112 mEq/L   CO2 27  19 - 32 mEq/L   Glucose, Bld 83  70 - 99 mg/dL   BUN 10  6 - 23 mg/dL   Creatinine, Ser  0.50  0.50 - 1.35 mg/dL   Calcium 9.1  8.4  - 16.1 mg/dL   GFR calc non Af Amer >90  >90 mL/min   GFR calc Af Amer >90  >90 mL/min     Patient with normal labs x-Tullio Chausse without any sign of fracture he does have a tiny radiopaque foreign body Mrs. soft tissue of the second digit but does not appear to be related to date today's event. Plan immobilization and pain medicine. I suspect this is the patient's gout. He is given precautions to return including increased redness, swelling, discharge, or fever.   Hilario Quarry, MD 11/11/12 2151  Patient vomited after percocet and requests something for pain.   Hilario Quarry, MD 11/11/12 2156

## 2012-11-11 NOTE — ED Notes (Signed)
Pt c/o right hand pain and swelling 12 hrs HX gout

## 2012-12-23 ENCOUNTER — Encounter: Payer: Self-pay | Admitting: Gastroenterology

## 2013-07-05 ENCOUNTER — Encounter: Payer: Self-pay | Admitting: Podiatrist

## 2013-07-05 ENCOUNTER — Ambulatory Visit (INDEPENDENT_AMBULATORY_CARE_PROVIDER_SITE_OTHER): Payer: BC Managed Care – PPO | Admitting: Podiatrist

## 2013-07-05 VITALS — BP 120/66 | HR 72 | Resp 12

## 2013-07-05 DIAGNOSIS — L84 Corns and callosities: Secondary | ICD-10-CM

## 2013-07-05 MED ORDER — OXYCODONE-ACETAMINOPHEN 5-325 MG PO TABS: 1.0000 | ORAL_TABLET | Freq: Four times a day (QID) | ORAL | Status: DC | PRN

## 2013-07-05 MED ORDER — PREGABALIN 150 MG PO CAPS: 150.0000 mg | ORAL_CAPSULE | Freq: Every day | ORAL | Status: DC

## 2013-07-05 MED ORDER — TRAMADOL HCL 50 MG PO TABS: 50.0000 mg | ORAL_TABLET | Freq: Four times a day (QID) | ORAL | Status: AC | PRN

## 2013-07-05 NOTE — Patient Instructions (Signed)

## 2013-07-07 NOTE — Progress Notes (Signed)
Subjective: Patient presents today for a painful callus distal tip of the right third toe. Patient states it's become severely painful and uncomfortable especially in shoes. He works out and relates that he has pain during activity as well. The patient is no longer diabetic but continues to have neuropathic symptoms to bilateral feet.  Objective: Vascular status is intact with palpable pedal pulses bilateral DP and PT. Capillary refill time is within normal limits. Neurological sensation continues to be decreased via Semmes Weinstein monofilament at 4/5 sites bilateral. Discomfort at the distal tip of the right third toe is also noted. A small callus is present at the distal tip of the right third toe. Underlying integument is intact. No sign of infection is present. The toe itself is contracted.  Assessment: Contracture of digits with callus tip of toe  Plan: Anesthetized the toe with lidocaine and Marcaine mixture without complication. Debrided the callus with a #15 blade without complication and padded. A prescription for pain medication and recommended Lyrica for the neuropathic pain. Prescription for Lyrica was also written. He'll be seen back as needed for followup and if any problems or concerns arise or any signs of infection are noted he will call

## 2013-08-18 ENCOUNTER — Telehealth: Payer: Self-pay | Admitting: *Deleted

## 2013-08-18 NOTE — Telephone Encounter (Signed)
Oxycodone was a 1 time Rx.  If he feels he needs narcotic, he will need a referral to a pain management specialist.

## 2013-08-18 NOTE — Telephone Encounter (Signed)
Dr Irving Shows states if pt does not want to take Lyrica 150mg  one capsule bid, then we would refer to pain management.  I informed the pt and he stated he would try Lyrica twice a day.

## 2013-08-18 NOTE — Telephone Encounter (Signed)
Pt request refill of Oxycodone 

## 2013-08-21 ENCOUNTER — Telehealth: Payer: Self-pay | Admitting: *Deleted

## 2013-08-21 NOTE — Telephone Encounter (Signed)
Patient called stating that he is in a great deal of pain with the 3rd toe that Dr Irving Shows has been trimming. He states the Lyrica is helping his neuropathic pain but not the pain in the toe. He is requesting an appointment on Friday to follow up about this toe pain. Advised pt I would have the schedulers call him to se this up.

## 2013-08-21 NOTE — Telephone Encounter (Signed)
Called pt LM to call back.

## 2013-08-21 NOTE — Telephone Encounter (Signed)
SCHEDULED APPT FOR 08/25/13

## 2013-08-25 ENCOUNTER — Telehealth: Payer: Self-pay | Admitting: *Deleted

## 2013-08-25 ENCOUNTER — Encounter: Payer: Self-pay | Admitting: Podiatrist

## 2013-08-25 ENCOUNTER — Ambulatory Visit (INDEPENDENT_AMBULATORY_CARE_PROVIDER_SITE_OTHER): Payer: BC Managed Care – PPO | Admitting: Podiatrist

## 2013-08-25 VITALS — BP 122/73 | HR 67 | Resp 18

## 2013-08-25 DIAGNOSIS — L84 Corns and callosities: Secondary | ICD-10-CM

## 2013-08-25 DIAGNOSIS — M898X9 Other specified disorders of bone, unspecified site: Secondary | ICD-10-CM

## 2013-08-25 DIAGNOSIS — M2040 Other hammer toe(s) (acquired), unspecified foot: Secondary | ICD-10-CM

## 2013-08-25 MED ORDER — OXYCODONE-ACETAMINOPHEN 5-325 MG PO TABS: 1.0000 | ORAL_TABLET | Freq: Four times a day (QID) | ORAL | Status: DC | PRN

## 2013-08-25 NOTE — Progress Notes (Signed)
   Subjective:    Patient ID: [name], male    DOB: Jul 09, 1950, 63 y.o.   MRN: [id number]  HPI I am still hurting on my 3rd toe on right and the lyrica is working with the diabetes pain.  Patient relates he has severe pain on the right 3rd toe.  He has been filing down the lesion with his wife's file.  He states the pain medication works for the pain but when he doesn't have it, he has discomfort          Review of Systems- unchanged from last visit     Objective:   Physical Exam Continued mild neuropathy present distal digits which the Lyrica is helping.  Pinpoint discomfort is present at the distal tip of the right third toe. Contracture at the optimal and distal interphalangeal joint is noted with rigid contracture distally. Hyperkeratotic lesion is present on the distal tip of the toe which is painful and symptomatic with pressure. Vascular status is intact with palpable pedal pulses 2/4 DP and PT right.       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 I recommended a distal phalanx joint arthroplasty with exostosis at the distal tip of the right third toe with excision of lesion. This could be performed in the office. The patient is going to [facility] in 2 weeks and would like to consider the procedure when he gets back. I will have billing check into the cost and give him an estimate. He will call when he would like to set up the procedure.

## 2013-08-25 NOTE — Telephone Encounter (Signed)
Pt request to schedule office surgery with DR. Egerton.  Scheduled for 09/16/2103 at 330pm, pt to arrive at 315pm to sign consent for right 3rd hammer toe repair.

## 2013-08-25 NOTE — Patient Instructions (Signed)
Call when you would like to schedule the in office surgery to fix your right 3rd toe-- ask to speak with [facility] on the Nurses line as she will schedule the procedure for you.  It is typically done at the end of the day on a Thursday or a Friday.  You will have sutures and have to keep the toe dry for 2 weeks after the procedure.  You will need to bring loose fitting shoes or sandals for the dressing I will put on the toe.

## 2013-09-08 ENCOUNTER — Encounter: Payer: Self-pay | Admitting: *Deleted

## 2013-09-11 NOTE — Progress Notes (Signed)
Cancellation of office surgery - Right 3rd hammer toe repair.

## 2013-09-15 ENCOUNTER — Ambulatory Visit: Payer: BC Managed Care – PPO | Admitting: Podiatrist

## 2013-09-22 ENCOUNTER — Encounter: Payer: BC Managed Care – PPO | Admitting: Podiatrist

## 2014-01-31 DIAGNOSIS — M1710 Unilateral primary osteoarthritis, unspecified knee: Secondary | ICD-10-CM | POA: Diagnosis not present

## 2014-02-01 DIAGNOSIS — M1710 Unilateral primary osteoarthritis, unspecified knee: Secondary | ICD-10-CM | POA: Diagnosis not present

## 2014-02-08 DIAGNOSIS — Z79899 Other long term (current) drug therapy: Secondary | ICD-10-CM | POA: Diagnosis not present

## 2014-02-08 DIAGNOSIS — D66 Hereditary factor VIII deficiency: Secondary | ICD-10-CM | POA: Diagnosis not present

## 2014-02-08 DIAGNOSIS — M47817 Spondylosis without myelopathy or radiculopathy, lumbosacral region: Secondary | ICD-10-CM | POA: Diagnosis not present

## 2014-02-08 DIAGNOSIS — M1710 Unilateral primary osteoarthritis, unspecified knee: Secondary | ICD-10-CM | POA: Diagnosis not present

## 2014-02-08 DIAGNOSIS — K219 Gastro-esophageal reflux disease without esophagitis: Secondary | ICD-10-CM | POA: Diagnosis not present

## 2014-02-08 DIAGNOSIS — Z96659 Presence of unspecified artificial knee joint: Secondary | ICD-10-CM | POA: Diagnosis not present

## 2014-02-08 DIAGNOSIS — M25569 Pain in unspecified knee: Secondary | ICD-10-CM | POA: Diagnosis not present

## 2014-02-08 DIAGNOSIS — M810 Age-related osteoporosis without current pathological fracture: Secondary | ICD-10-CM | POA: Diagnosis not present

## 2014-02-08 DIAGNOSIS — Z9884 Bariatric surgery status: Secondary | ICD-10-CM | POA: Diagnosis not present

## 2014-02-08 DIAGNOSIS — Z9089 Acquired absence of other organs: Secondary | ICD-10-CM | POA: Diagnosis not present

## 2014-02-08 DIAGNOSIS — M5137 Other intervertebral disc degeneration, lumbosacral region: Secondary | ICD-10-CM | POA: Diagnosis not present

## 2014-02-21 DIAGNOSIS — K4020 Bilateral inguinal hernia, without obstruction or gangrene, not specified as recurrent: Secondary | ICD-10-CM | POA: Diagnosis not present

## 2014-02-21 DIAGNOSIS — K219 Gastro-esophageal reflux disease without esophagitis: Secondary | ICD-10-CM | POA: Diagnosis not present

## 2014-02-21 DIAGNOSIS — I4949 Other premature depolarization: Secondary | ICD-10-CM | POA: Diagnosis not present

## 2014-02-21 DIAGNOSIS — M069 Rheumatoid arthritis, unspecified: Secondary | ICD-10-CM | POA: Diagnosis not present

## 2014-02-21 DIAGNOSIS — Z9884 Bariatric surgery status: Secondary | ICD-10-CM | POA: Diagnosis not present

## 2014-02-28 DIAGNOSIS — M76899 Other specified enthesopathies of unspecified lower limb, excluding foot: Secondary | ICD-10-CM | POA: Diagnosis not present

## 2014-02-28 DIAGNOSIS — M719 Bursopathy, unspecified: Secondary | ICD-10-CM | POA: Diagnosis not present

## 2014-02-28 DIAGNOSIS — M1710 Unilateral primary osteoarthritis, unspecified knee: Secondary | ICD-10-CM | POA: Diagnosis not present

## 2014-02-28 DIAGNOSIS — M67919 Unspecified disorder of synovium and tendon, unspecified shoulder: Secondary | ICD-10-CM | POA: Diagnosis not present

## 2014-02-28 DIAGNOSIS — M109 Gout, unspecified: Secondary | ICD-10-CM | POA: Diagnosis not present

## 2014-03-06 DIAGNOSIS — K4020 Bilateral inguinal hernia, without obstruction or gangrene, not specified as recurrent: Secondary | ICD-10-CM | POA: Diagnosis not present

## 2014-03-06 DIAGNOSIS — Z6826 Body mass index (BMI) 26.0-26.9, adult: Secondary | ICD-10-CM | POA: Diagnosis not present

## 2014-03-06 DIAGNOSIS — K4090 Unilateral inguinal hernia, without obstruction or gangrene, not specified as recurrent: Secondary | ICD-10-CM | POA: Diagnosis not present

## 2014-03-06 DIAGNOSIS — Z23 Encounter for immunization: Secondary | ICD-10-CM | POA: Diagnosis not present

## 2014-03-06 DIAGNOSIS — Z809 Family history of malignant neoplasm, unspecified: Secondary | ICD-10-CM | POA: Diagnosis not present

## 2014-03-06 DIAGNOSIS — K219 Gastro-esophageal reflux disease without esophagitis: Secondary | ICD-10-CM | POA: Diagnosis not present

## 2014-03-19 ENCOUNTER — Encounter: Payer: Self-pay | Admitting: Podiatry

## 2014-03-19 ENCOUNTER — Ambulatory Visit (INDEPENDENT_AMBULATORY_CARE_PROVIDER_SITE_OTHER): Payer: Medicare Other | Admitting: Podiatry

## 2014-03-19 VITALS — BP 129/76 | HR 64 | Ht 73.0 in | Wt 202.0 lb

## 2014-03-19 DIAGNOSIS — M2040 Other hammer toe(s) (acquired), unspecified foot: Secondary | ICD-10-CM | POA: Diagnosis not present

## 2014-03-19 DIAGNOSIS — G589 Mononeuropathy, unspecified: Secondary | ICD-10-CM | POA: Diagnosis not present

## 2014-03-19 DIAGNOSIS — M79609 Pain in unspecified limb: Secondary | ICD-10-CM

## 2014-03-19 DIAGNOSIS — M79606 Pain in leg, unspecified: Secondary | ICD-10-CM | POA: Insufficient documentation

## 2014-03-19 DIAGNOSIS — G629 Polyneuropathy, unspecified: Secondary | ICD-10-CM | POA: Insufficient documentation

## 2014-03-19 DIAGNOSIS — L851 Acquired keratosis [keratoderma] palmaris et plantaris: Secondary | ICD-10-CM

## 2014-03-19 DIAGNOSIS — M810 Age-related osteoporosis without current pathological fracture: Secondary | ICD-10-CM | POA: Diagnosis not present

## 2014-03-19 DIAGNOSIS — R5381 Other malaise: Secondary | ICD-10-CM | POA: Diagnosis not present

## 2014-03-19 DIAGNOSIS — E559 Vitamin D deficiency, unspecified: Secondary | ICD-10-CM | POA: Diagnosis not present

## 2014-03-19 DIAGNOSIS — M79604 Pain in right leg: Secondary | ICD-10-CM

## 2014-03-19 DIAGNOSIS — L570 Actinic keratosis: Secondary | ICD-10-CM

## 2014-03-19 NOTE — Progress Notes (Signed)
Subjective: 63 year old diabetic male presents complaining of pain on 3rd digit right foot x 2 years. Been diabetic for the past 15 years. Stated that he was diagnosed with Neuropathy x 5 years. Both feet are very painful at the end of the day. Taking Neurontin for the pain that helps some but not enough to relieve evening pain. Patient stated that he has lost over 100 lbs since he had Gastric bypass 3 years ago.   Review of Systems - General ROS: negative for - chills, fatigue, fever, night sweats or sleep disturbance Ophthalmic ROS: negative ENT ROS: negative Respiratory ROS: no cough, shortness of breath, or wheezing Cardiovascular ROS: no chest pain or dyspnea on exertion Gastrointestinal ROS: Has had hernia surgery 2 weeks ago and doing well. Genito-Urinary ROS: no dysuria, trouble voiding, or hematuria Musculoskeletal ROS: Right knee pain with brace on and will have knee surgery soon.  Neurological ROS: Peripheral neuropathy bilateral. Dermatological ROS: Severe painful distal clavi 3rd digit right.  Objective: Vascular status are within normal.  Severe contracture lesser digits 2, 3 bilateral. Severe painful keratotic lesion distal end 3rd digit right foot.  Hyperalgic foot bilateral.  Assessment: Hammer toe deformity 2nd and 3rd bilateral. Painful corn distal end 3rd digit right. Peripheral neuropathy bilateral lower limbs.  Plan: Reviewed findings and available options including Neuremedy for neuropathy and tenotomy for painful corn. Painful lesion debrided and buttress pad applied. Patient wishes to have surgical intervention to prevent further pain. Will do Flexor tenotomy to reduce plantar flexory force on 3rd digit right.

## 2014-03-19 NOTE — Patient Instructions (Signed)
Seen for painful corn. Debrided and padded. Patient will return for office surgery.

## 2014-03-23 DIAGNOSIS — M810 Age-related osteoporosis without current pathological fracture: Secondary | ICD-10-CM | POA: Diagnosis not present

## 2014-03-23 DIAGNOSIS — E559 Vitamin D deficiency, unspecified: Secondary | ICD-10-CM | POA: Diagnosis not present

## 2014-03-26 ENCOUNTER — Ambulatory Visit (INDEPENDENT_AMBULATORY_CARE_PROVIDER_SITE_OTHER): Payer: Medicare Other | Admitting: Podiatry

## 2014-03-26 ENCOUNTER — Encounter: Payer: Self-pay | Admitting: Podiatry

## 2014-03-26 VITALS — BP 137/61 | HR 85

## 2014-03-26 DIAGNOSIS — M2040 Other hammer toe(s) (acquired), unspecified foot: Secondary | ICD-10-CM

## 2014-03-26 DIAGNOSIS — M79609 Pain in unspecified limb: Secondary | ICD-10-CM | POA: Diagnosis not present

## 2014-03-26 DIAGNOSIS — M79604 Pain in right leg: Secondary | ICD-10-CM

## 2014-03-26 NOTE — Progress Notes (Signed)
63 year old male presents for office surgery, Flexor tenotomy 3rd digit right. Consent form reviewed. Local used total 6ml 50/50 mixture 0.5% Marcaine plain and 1% Xylocaine with epinephrine. Area prepped with Iodine scrub. Flexor tenon released 3rd digit at IPJ right. Suture place with 4/0 Nylon. Intraoperative bleeding was minimum. Compression dressing applied. Patient is to return this Friday. May remove sutures.

## 2014-03-26 NOTE — Patient Instructions (Signed)
Office surgery, Flexor tenotomy 3rd digit right done. Keep the dressing dry till return to the office. Return this Friday for dressing change.

## 2014-03-28 DIAGNOSIS — K4090 Unilateral inguinal hernia, without obstruction or gangrene, not specified as recurrent: Secondary | ICD-10-CM | POA: Diagnosis not present

## 2014-03-30 ENCOUNTER — Ambulatory Visit (INDEPENDENT_AMBULATORY_CARE_PROVIDER_SITE_OTHER): Payer: Medicare Other | Admitting: Podiatry

## 2014-03-30 ENCOUNTER — Encounter: Payer: Self-pay | Admitting: Podiatry

## 2014-03-30 DIAGNOSIS — M79604 Pain in right leg: Secondary | ICD-10-CM

## 2014-03-30 NOTE — Progress Notes (Signed)
Status post Tenotomy 3rd digit right. Doing well with no complaints. Suture removed. Selofix applied with instruction. Return as needed.

## 2014-03-30 NOTE — Patient Instructions (Signed)
Seen for post op right 3rd digit. Follow instruction and return as needed.

## 2014-04-11 DIAGNOSIS — I071 Rheumatic tricuspid insufficiency: Secondary | ICD-10-CM | POA: Diagnosis not present

## 2014-04-11 DIAGNOSIS — R010 Benign and innocent cardiac murmurs: Secondary | ICD-10-CM | POA: Diagnosis not present

## 2014-04-11 DIAGNOSIS — I340 Nonrheumatic mitral (valve) insufficiency: Secondary | ICD-10-CM | POA: Diagnosis not present

## 2014-04-11 DIAGNOSIS — M1711 Unilateral primary osteoarthritis, right knee: Secondary | ICD-10-CM | POA: Diagnosis not present

## 2014-04-20 ENCOUNTER — Ambulatory Visit (INDEPENDENT_AMBULATORY_CARE_PROVIDER_SITE_OTHER): Payer: Medicare Other | Admitting: Podiatry

## 2014-04-20 ENCOUNTER — Encounter: Payer: Self-pay | Admitting: Podiatry

## 2014-04-20 VITALS — BP 137/85 | HR 79

## 2014-04-20 DIAGNOSIS — M2041 Other hammer toe(s) (acquired), right foot: Secondary | ICD-10-CM

## 2014-04-20 DIAGNOSIS — M79604 Pain in right leg: Secondary | ICD-10-CM

## 2014-04-20 NOTE — Progress Notes (Signed)
Subjective: Stated that right 3rd toe has been good with no pain since Tenotomy surgery. Now the 2nd toe is very painful and wants the same procedure done. He is scheduled to have total knee replacement in November. He wants to have the toe fixed so that he can go through physical therapy after the knee surgery.   Objective: Severely contracted 2nd digit with distal corn. 3rd digit is laying straight without contracture.  Neuropathy is the same. Right knee pain with contracted Achilles tendon.  Assessment: Painful hammer toe 2nd right. Peripheral neuropathy.  Plan: Buttress pad dispensed x 2. Patient will return to have Flexor tenotomy 2nd right.

## 2014-04-20 NOTE — Patient Instructions (Signed)
Seen for pain on 2nd toe right. May benefit from Flexor tenotomy.

## 2014-04-23 DIAGNOSIS — M1711 Unilateral primary osteoarthritis, right knee: Secondary | ICD-10-CM | POA: Diagnosis not present

## 2014-04-23 DIAGNOSIS — M6281 Muscle weakness (generalized): Secondary | ICD-10-CM | POA: Diagnosis not present

## 2014-04-23 DIAGNOSIS — Z5189 Encounter for other specified aftercare: Secondary | ICD-10-CM | POA: Diagnosis not present

## 2014-04-23 DIAGNOSIS — M25561 Pain in right knee: Secondary | ICD-10-CM | POA: Diagnosis not present

## 2014-04-23 DIAGNOSIS — M25661 Stiffness of right knee, not elsewhere classified: Secondary | ICD-10-CM | POA: Diagnosis not present

## 2014-04-25 ENCOUNTER — Encounter: Payer: Self-pay | Admitting: Podiatry

## 2014-04-25 ENCOUNTER — Ambulatory Visit (INDEPENDENT_AMBULATORY_CARE_PROVIDER_SITE_OTHER): Payer: Medicare Other | Admitting: Podiatry

## 2014-04-25 VITALS — BP 122/73 | HR 90

## 2014-04-25 DIAGNOSIS — M79604 Pain in right leg: Secondary | ICD-10-CM

## 2014-04-25 DIAGNOSIS — M2041 Other hammer toe(s) (acquired), right foot: Secondary | ICD-10-CM

## 2014-04-25 NOTE — Progress Notes (Signed)
63 year old male presents for office surgery, Flexor tenotomy 2nd digit right.  He has had flexor tenotomy on 3rd right and was satisfactory. He is not having the same problem with the 2nd digit and wants to have the procedure.  Consent form reviewed.  Local used total 45ml 50/50 mixture 0.5% Marcaine plain and 1% Xylocaine with epinephrine.  Area prepped with Iodine scrub.  Flexor tenon released 2nd digit at PIPJ right foot.  Suture place with 4/0 Nylon.  Intraoperative bleeding was minimum.  Compression dressing applied.  Patient is to return in one week.  May remove sutures.

## 2014-04-25 NOTE — Patient Instructions (Signed)
In office surgery, flexor tenotomy 2nd digit right done. Keep bandage dry till next appointment.

## 2014-05-02 ENCOUNTER — Ambulatory Visit (INDEPENDENT_AMBULATORY_CARE_PROVIDER_SITE_OTHER): Payer: Medicare Other | Admitting: Podiatry

## 2014-05-02 ENCOUNTER — Encounter: Payer: Self-pay | Admitting: Podiatry

## 2014-05-02 DIAGNOSIS — M2041 Other hammer toe(s) (acquired), right foot: Secondary | ICD-10-CM

## 2014-05-02 NOTE — Progress Notes (Signed)
One week post op following flexor tenotomy of 2nd digit right. He is scheduled for right total knee surgery this Monday and being nervous about it. Wound is dry and healing well. Suture removed. Dry dressing applied with instruction.  Return as needed.

## 2014-05-02 NOTE — Patient Instructions (Signed)
Doing well following Flexor tenotomy. Keep it dry for another 3 days.

## 2014-05-07 DIAGNOSIS — M6281 Muscle weakness (generalized): Secondary | ICD-10-CM | POA: Diagnosis not present

## 2014-05-07 DIAGNOSIS — I82491 Acute embolism and thrombosis of other specified deep vein of right lower extremity: Secondary | ICD-10-CM | POA: Diagnosis not present

## 2014-05-07 DIAGNOSIS — G8918 Other acute postprocedural pain: Secondary | ICD-10-CM | POA: Diagnosis not present

## 2014-05-07 DIAGNOSIS — K219 Gastro-esophageal reflux disease without esophagitis: Secondary | ICD-10-CM | POA: Diagnosis present

## 2014-05-07 DIAGNOSIS — E1140 Type 2 diabetes mellitus with diabetic neuropathy, unspecified: Secondary | ICD-10-CM | POA: Diagnosis present

## 2014-05-07 DIAGNOSIS — R2681 Unsteadiness on feet: Secondary | ICD-10-CM | POA: Diagnosis not present

## 2014-05-07 DIAGNOSIS — Z96651 Presence of right artificial knee joint: Secondary | ICD-10-CM | POA: Diagnosis not present

## 2014-05-07 DIAGNOSIS — M1711 Unilateral primary osteoarthritis, right knee: Secondary | ICD-10-CM | POA: Diagnosis not present

## 2014-05-07 DIAGNOSIS — L03115 Cellulitis of right lower limb: Secondary | ICD-10-CM | POA: Diagnosis not present

## 2014-05-07 DIAGNOSIS — Z9884 Bariatric surgery status: Secondary | ICD-10-CM | POA: Diagnosis not present

## 2014-05-07 DIAGNOSIS — M1990 Unspecified osteoarthritis, unspecified site: Secondary | ICD-10-CM | POA: Diagnosis not present

## 2014-05-07 DIAGNOSIS — Z471 Aftercare following joint replacement surgery: Secondary | ICD-10-CM | POA: Diagnosis not present

## 2014-05-07 DIAGNOSIS — Z96652 Presence of left artificial knee joint: Secondary | ICD-10-CM | POA: Diagnosis present

## 2014-05-07 DIAGNOSIS — D62 Acute posthemorrhagic anemia: Secondary | ICD-10-CM | POA: Diagnosis not present

## 2014-05-07 DIAGNOSIS — M810 Age-related osteoporosis without current pathological fracture: Secondary | ICD-10-CM | POA: Diagnosis not present

## 2014-05-07 DIAGNOSIS — M818 Other osteoporosis without current pathological fracture: Secondary | ICD-10-CM | POA: Diagnosis present

## 2014-05-10 DIAGNOSIS — L03116 Cellulitis of left lower limb: Secondary | ICD-10-CM | POA: Diagnosis not present

## 2014-05-10 DIAGNOSIS — Z9889 Other specified postprocedural states: Secondary | ICD-10-CM | POA: Diagnosis not present

## 2014-05-10 DIAGNOSIS — M6281 Muscle weakness (generalized): Secondary | ICD-10-CM | POA: Diagnosis not present

## 2014-05-10 DIAGNOSIS — E1140 Type 2 diabetes mellitus with diabetic neuropathy, unspecified: Secondary | ICD-10-CM | POA: Diagnosis not present

## 2014-05-10 DIAGNOSIS — L03115 Cellulitis of right lower limb: Secondary | ICD-10-CM | POA: Diagnosis not present

## 2014-05-10 DIAGNOSIS — I82409 Acute embolism and thrombosis of unspecified deep veins of unspecified lower extremity: Secondary | ICD-10-CM | POA: Diagnosis not present

## 2014-05-10 DIAGNOSIS — Z96651 Presence of right artificial knee joint: Secondary | ICD-10-CM | POA: Diagnosis not present

## 2014-05-10 DIAGNOSIS — D509 Iron deficiency anemia, unspecified: Secondary | ICD-10-CM | POA: Diagnosis not present

## 2014-05-10 DIAGNOSIS — B400 Acute pulmonary blastomycosis: Secondary | ICD-10-CM | POA: Diagnosis not present

## 2014-05-10 DIAGNOSIS — Z9884 Bariatric surgery status: Secondary | ICD-10-CM | POA: Diagnosis not present

## 2014-05-10 DIAGNOSIS — Z471 Aftercare following joint replacement surgery: Secondary | ICD-10-CM | POA: Diagnosis not present

## 2014-05-10 DIAGNOSIS — K219 Gastro-esophageal reflux disease without esophagitis: Secondary | ICD-10-CM | POA: Diagnosis not present

## 2014-05-10 DIAGNOSIS — Z7982 Long term (current) use of aspirin: Secondary | ICD-10-CM | POA: Diagnosis not present

## 2014-05-10 DIAGNOSIS — M25559 Pain in unspecified hip: Secondary | ICD-10-CM | POA: Diagnosis not present

## 2014-05-10 DIAGNOSIS — R2681 Unsteadiness on feet: Secondary | ICD-10-CM | POA: Diagnosis not present

## 2014-05-10 DIAGNOSIS — G894 Chronic pain syndrome: Secondary | ICD-10-CM | POA: Diagnosis not present

## 2014-05-10 DIAGNOSIS — D649 Anemia, unspecified: Secondary | ICD-10-CM | POA: Diagnosis not present

## 2014-05-10 DIAGNOSIS — D66 Hereditary factor VIII deficiency: Secondary | ICD-10-CM | POA: Diagnosis not present

## 2014-05-10 DIAGNOSIS — Z4733 Aftercare following explantation of knee joint prosthesis: Secondary | ICD-10-CM | POA: Diagnosis not present

## 2014-05-10 DIAGNOSIS — M1990 Unspecified osteoarthritis, unspecified site: Secondary | ICD-10-CM | POA: Diagnosis not present

## 2014-05-10 DIAGNOSIS — M810 Age-related osteoporosis without current pathological fracture: Secondary | ICD-10-CM | POA: Diagnosis not present

## 2014-05-10 DIAGNOSIS — M62838 Other muscle spasm: Secondary | ICD-10-CM | POA: Diagnosis not present

## 2014-05-10 DIAGNOSIS — I82441 Acute embolism and thrombosis of right tibial vein: Secondary | ICD-10-CM | POA: Diagnosis not present

## 2014-05-10 DIAGNOSIS — I82A29 Chronic embolism and thrombosis of unspecified axillary vein: Secondary | ICD-10-CM | POA: Diagnosis not present

## 2014-05-10 DIAGNOSIS — I82491 Acute embolism and thrombosis of other specified deep vein of right lower extremity: Secondary | ICD-10-CM | POA: Diagnosis not present

## 2014-05-10 DIAGNOSIS — M25569 Pain in unspecified knee: Secondary | ICD-10-CM | POA: Diagnosis not present

## 2014-05-11 DIAGNOSIS — I82491 Acute embolism and thrombosis of other specified deep vein of right lower extremity: Secondary | ICD-10-CM | POA: Diagnosis not present

## 2014-05-11 DIAGNOSIS — Z96651 Presence of right artificial knee joint: Secondary | ICD-10-CM | POA: Diagnosis not present

## 2014-05-11 DIAGNOSIS — Z471 Aftercare following joint replacement surgery: Secondary | ICD-10-CM | POA: Diagnosis not present

## 2014-05-11 DIAGNOSIS — L03116 Cellulitis of left lower limb: Secondary | ICD-10-CM | POA: Diagnosis not present

## 2014-05-12 DIAGNOSIS — I82491 Acute embolism and thrombosis of other specified deep vein of right lower extremity: Secondary | ICD-10-CM | POA: Diagnosis not present

## 2014-05-12 DIAGNOSIS — I82441 Acute embolism and thrombosis of right tibial vein: Secondary | ICD-10-CM | POA: Diagnosis not present

## 2014-05-12 DIAGNOSIS — Z9889 Other specified postprocedural states: Secondary | ICD-10-CM | POA: Diagnosis not present

## 2014-05-15 DIAGNOSIS — B400 Acute pulmonary blastomycosis: Secondary | ICD-10-CM | POA: Diagnosis not present

## 2014-05-15 DIAGNOSIS — M25569 Pain in unspecified knee: Secondary | ICD-10-CM | POA: Diagnosis not present

## 2014-05-15 DIAGNOSIS — Z471 Aftercare following joint replacement surgery: Secondary | ICD-10-CM | POA: Diagnosis not present

## 2014-05-15 DIAGNOSIS — D649 Anemia, unspecified: Secondary | ICD-10-CM | POA: Diagnosis not present

## 2014-05-15 DIAGNOSIS — I82409 Acute embolism and thrombosis of unspecified deep veins of unspecified lower extremity: Secondary | ICD-10-CM | POA: Diagnosis not present

## 2014-05-15 DIAGNOSIS — E1140 Type 2 diabetes mellitus with diabetic neuropathy, unspecified: Secondary | ICD-10-CM | POA: Diagnosis not present

## 2014-05-16 DIAGNOSIS — Z96651 Presence of right artificial knee joint: Secondary | ICD-10-CM | POA: Diagnosis not present

## 2014-05-16 DIAGNOSIS — Z471 Aftercare following joint replacement surgery: Secondary | ICD-10-CM | POA: Diagnosis not present

## 2014-05-17 DIAGNOSIS — M25559 Pain in unspecified hip: Secondary | ICD-10-CM | POA: Diagnosis not present

## 2014-05-22 DIAGNOSIS — G894 Chronic pain syndrome: Secondary | ICD-10-CM | POA: Diagnosis not present

## 2014-05-22 DIAGNOSIS — Z4733 Aftercare following explantation of knee joint prosthesis: Secondary | ICD-10-CM | POA: Diagnosis not present

## 2014-05-22 DIAGNOSIS — I82A29 Chronic embolism and thrombosis of unspecified axillary vein: Secondary | ICD-10-CM | POA: Diagnosis not present

## 2014-05-22 DIAGNOSIS — D509 Iron deficiency anemia, unspecified: Secondary | ICD-10-CM | POA: Diagnosis not present

## 2014-05-30 ENCOUNTER — Ambulatory Visit: Payer: Medicare Other | Attending: Adult Reconstructive Orthopaedic Surgery

## 2014-05-30 DIAGNOSIS — M25661 Stiffness of right knee, not elsewhere classified: Secondary | ICD-10-CM | POA: Diagnosis not present

## 2014-05-30 DIAGNOSIS — M79661 Pain in right lower leg: Secondary | ICD-10-CM | POA: Insufficient documentation

## 2014-06-01 ENCOUNTER — Ambulatory Visit: Payer: Medicare Other | Admitting: Rehabilitation

## 2014-06-01 DIAGNOSIS — M79661 Pain in right lower leg: Secondary | ICD-10-CM | POA: Diagnosis not present

## 2014-06-06 ENCOUNTER — Ambulatory Visit: Payer: Medicare Other

## 2014-06-06 DIAGNOSIS — M79661 Pain in right lower leg: Secondary | ICD-10-CM | POA: Diagnosis not present

## 2014-06-12 ENCOUNTER — Ambulatory Visit: Payer: Medicare Other | Admitting: Rehabilitation

## 2014-06-12 DIAGNOSIS — M7611 Psoas tendinitis, right hip: Secondary | ICD-10-CM | POA: Diagnosis not present

## 2014-06-12 DIAGNOSIS — M9902 Segmental and somatic dysfunction of thoracic region: Secondary | ICD-10-CM | POA: Diagnosis not present

## 2014-06-12 DIAGNOSIS — M4716 Other spondylosis with myelopathy, lumbar region: Secondary | ICD-10-CM | POA: Diagnosis not present

## 2014-06-12 DIAGNOSIS — M9905 Segmental and somatic dysfunction of pelvic region: Secondary | ICD-10-CM | POA: Diagnosis not present

## 2014-06-12 DIAGNOSIS — M47814 Spondylosis without myelopathy or radiculopathy, thoracic region: Secondary | ICD-10-CM | POA: Diagnosis not present

## 2014-06-12 DIAGNOSIS — M9903 Segmental and somatic dysfunction of lumbar region: Secondary | ICD-10-CM | POA: Diagnosis not present

## 2014-06-12 DIAGNOSIS — M5106 Intervertebral disc disorders with myelopathy, lumbar region: Secondary | ICD-10-CM | POA: Diagnosis not present

## 2014-06-12 DIAGNOSIS — M7061 Trochanteric bursitis, right hip: Secondary | ICD-10-CM | POA: Diagnosis not present

## 2014-06-13 ENCOUNTER — Ambulatory Visit: Payer: Medicare Other

## 2014-06-13 DIAGNOSIS — M79661 Pain in right lower leg: Secondary | ICD-10-CM | POA: Diagnosis not present

## 2014-06-15 ENCOUNTER — Ambulatory Visit: Payer: Medicare Other | Admitting: Physical Therapy

## 2014-06-15 DIAGNOSIS — M9903 Segmental and somatic dysfunction of lumbar region: Secondary | ICD-10-CM | POA: Diagnosis not present

## 2014-06-15 DIAGNOSIS — M7061 Trochanteric bursitis, right hip: Secondary | ICD-10-CM | POA: Diagnosis not present

## 2014-06-15 DIAGNOSIS — M4716 Other spondylosis with myelopathy, lumbar region: Secondary | ICD-10-CM | POA: Diagnosis not present

## 2014-06-15 DIAGNOSIS — M9902 Segmental and somatic dysfunction of thoracic region: Secondary | ICD-10-CM | POA: Diagnosis not present

## 2014-06-15 DIAGNOSIS — M7611 Psoas tendinitis, right hip: Secondary | ICD-10-CM | POA: Diagnosis not present

## 2014-06-15 DIAGNOSIS — M9905 Segmental and somatic dysfunction of pelvic region: Secondary | ICD-10-CM | POA: Diagnosis not present

## 2014-06-15 DIAGNOSIS — M5106 Intervertebral disc disorders with myelopathy, lumbar region: Secondary | ICD-10-CM | POA: Diagnosis not present

## 2014-06-15 DIAGNOSIS — M47814 Spondylosis without myelopathy or radiculopathy, thoracic region: Secondary | ICD-10-CM | POA: Diagnosis not present

## 2014-06-15 DIAGNOSIS — M79661 Pain in right lower leg: Secondary | ICD-10-CM | POA: Diagnosis not present

## 2014-06-19 ENCOUNTER — Encounter (INDEPENDENT_AMBULATORY_CARE_PROVIDER_SITE_OTHER): Payer: Self-pay

## 2014-06-19 ENCOUNTER — Ambulatory Visit: Payer: Medicare Other | Admitting: Rehabilitation

## 2014-06-19 DIAGNOSIS — M79661 Pain in right lower leg: Secondary | ICD-10-CM | POA: Diagnosis not present

## 2014-06-20 ENCOUNTER — Ambulatory Visit: Payer: Medicare Other

## 2014-06-20 DIAGNOSIS — M79661 Pain in right lower leg: Secondary | ICD-10-CM | POA: Diagnosis not present

## 2014-06-20 DIAGNOSIS — M25461 Effusion, right knee: Secondary | ICD-10-CM | POA: Diagnosis not present

## 2014-06-20 DIAGNOSIS — Z471 Aftercare following joint replacement surgery: Secondary | ICD-10-CM | POA: Diagnosis not present

## 2014-06-20 DIAGNOSIS — Z96651 Presence of right artificial knee joint: Secondary | ICD-10-CM | POA: Diagnosis not present

## 2014-06-25 ENCOUNTER — Encounter: Payer: Medicare Other | Admitting: Rehabilitation

## 2014-06-26 ENCOUNTER — Ambulatory Visit: Payer: Medicare Other | Admitting: Rehabilitation

## 2014-06-27 ENCOUNTER — Ambulatory Visit: Payer: Medicare Other | Admitting: Rehabilitation

## 2014-06-27 DIAGNOSIS — M79661 Pain in right lower leg: Secondary | ICD-10-CM | POA: Diagnosis not present

## 2014-07-03 DIAGNOSIS — G629 Polyneuropathy, unspecified: Secondary | ICD-10-CM | POA: Diagnosis not present

## 2014-07-03 DIAGNOSIS — M109 Gout, unspecified: Secondary | ICD-10-CM | POA: Diagnosis not present

## 2014-07-03 DIAGNOSIS — M159 Polyosteoarthritis, unspecified: Secondary | ICD-10-CM | POA: Diagnosis not present

## 2014-08-02 DIAGNOSIS — Z471 Aftercare following joint replacement surgery: Secondary | ICD-10-CM | POA: Diagnosis not present

## 2014-08-02 DIAGNOSIS — Z96651 Presence of right artificial knee joint: Secondary | ICD-10-CM | POA: Diagnosis not present

## 2014-10-01 ENCOUNTER — Emergency Department (HOSPITAL_BASED_OUTPATIENT_CLINIC_OR_DEPARTMENT_OTHER): Payer: Medicare Other

## 2014-10-01 ENCOUNTER — Emergency Department (HOSPITAL_BASED_OUTPATIENT_CLINIC_OR_DEPARTMENT_OTHER)
Admission: EM | Admit: 2014-10-01 | Discharge: 2014-10-01 | Discharge status: Against Medical Advice | Payer: Medicare Other | Attending: Emergency Medicine | Admitting: Emergency Medicine

## 2014-10-01 ENCOUNTER — Encounter (HOSPITAL_BASED_OUTPATIENT_CLINIC_OR_DEPARTMENT_OTHER): Payer: Self-pay

## 2014-10-01 DIAGNOSIS — Y9389 Activity, other specified: Secondary | ICD-10-CM | POA: Diagnosis not present

## 2014-10-01 DIAGNOSIS — W208XXA Other cause of strike by thrown, projected or falling object, initial encounter: Secondary | ICD-10-CM | POA: Diagnosis not present

## 2014-10-01 DIAGNOSIS — S99922A Unspecified injury of left foot, initial encounter: Secondary | ICD-10-CM | POA: Diagnosis not present

## 2014-10-01 DIAGNOSIS — Y9289 Other specified places as the place of occurrence of the external cause: Secondary | ICD-10-CM | POA: Insufficient documentation

## 2014-10-01 DIAGNOSIS — Y998 Other external cause status: Secondary | ICD-10-CM | POA: Diagnosis not present

## 2014-10-01 DIAGNOSIS — S99921A Unspecified injury of right foot, initial encounter: Secondary | ICD-10-CM | POA: Diagnosis not present

## 2014-10-01 DIAGNOSIS — M79672 Pain in left foot: Secondary | ICD-10-CM | POA: Diagnosis not present

## 2014-10-01 NOTE — ED Notes (Signed)
Dropped "a lopper" on right foot yesterday-blood blisters noted to left 3rd toe

## 2015-01-08 DIAGNOSIS — Z79899 Other long term (current) drug therapy: Secondary | ICD-10-CM | POA: Diagnosis not present

## 2015-01-08 DIAGNOSIS — M25442 Effusion, left hand: Secondary | ICD-10-CM | POA: Diagnosis not present

## 2015-01-08 DIAGNOSIS — M109 Gout, unspecified: Secondary | ICD-10-CM | POA: Diagnosis not present

## 2015-01-08 DIAGNOSIS — G629 Polyneuropathy, unspecified: Secondary | ICD-10-CM | POA: Diagnosis not present

## 2015-01-08 DIAGNOSIS — M25569 Pain in unspecified knee: Secondary | ICD-10-CM | POA: Diagnosis not present

## 2015-01-08 DIAGNOSIS — M25441 Effusion, right hand: Secondary | ICD-10-CM | POA: Diagnosis not present

## 2015-02-12 DIAGNOSIS — G5602 Carpal tunnel syndrome, left upper limb: Secondary | ICD-10-CM | POA: Diagnosis not present

## 2015-02-12 DIAGNOSIS — M79641 Pain in right hand: Secondary | ICD-10-CM | POA: Diagnosis not present

## 2015-02-12 DIAGNOSIS — G5601 Carpal tunnel syndrome, right upper limb: Secondary | ICD-10-CM | POA: Diagnosis not present

## 2015-02-12 DIAGNOSIS — M25569 Pain in unspecified knee: Secondary | ICD-10-CM | POA: Diagnosis not present

## 2015-02-12 DIAGNOSIS — G629 Polyneuropathy, unspecified: Secondary | ICD-10-CM | POA: Diagnosis not present

## 2015-02-12 DIAGNOSIS — M79642 Pain in left hand: Secondary | ICD-10-CM | POA: Diagnosis not present

## 2015-02-18 DIAGNOSIS — H43811 Vitreous degeneration, right eye: Secondary | ICD-10-CM | POA: Diagnosis not present

## 2015-03-18 ENCOUNTER — Ambulatory Visit
Admission: RE | Admit: 2015-03-18 | Discharge: 2015-03-18 | Disposition: A | Discharge status: Home / Self Care | Payer: Medicare Other | Source: Ambulatory Visit | Attending: Physical Medicine and Rehabilitation | Admitting: Physical Medicine and Rehabilitation

## 2015-03-18 ENCOUNTER — Other Ambulatory Visit: Payer: Self-pay | Admitting: Physical Medicine and Rehabilitation

## 2015-03-18 DIAGNOSIS — G8929 Other chronic pain: Secondary | ICD-10-CM

## 2015-03-18 DIAGNOSIS — Z96653 Presence of artificial knee joint, bilateral: Secondary | ICD-10-CM | POA: Diagnosis not present

## 2015-03-18 DIAGNOSIS — M545 Low back pain, unspecified: Secondary | ICD-10-CM

## 2015-03-18 DIAGNOSIS — M47816 Spondylosis without myelopathy or radiculopathy, lumbar region: Secondary | ICD-10-CM | POA: Diagnosis not present

## 2015-04-30 DIAGNOSIS — M545 Low back pain: Secondary | ICD-10-CM | POA: Diagnosis not present

## 2015-04-30 DIAGNOSIS — Z96653 Presence of artificial knee joint, bilateral: Secondary | ICD-10-CM | POA: Diagnosis not present

## 2015-04-30 DIAGNOSIS — G8929 Other chronic pain: Secondary | ICD-10-CM | POA: Diagnosis not present

## 2015-05-02 DIAGNOSIS — Z471 Aftercare following joint replacement surgery: Secondary | ICD-10-CM | POA: Diagnosis not present

## 2015-05-02 DIAGNOSIS — Z96651 Presence of right artificial knee joint: Secondary | ICD-10-CM | POA: Diagnosis not present

## 2015-05-06 DIAGNOSIS — M4806 Spinal stenosis, lumbar region: Secondary | ICD-10-CM | POA: Diagnosis not present

## 2015-05-20 DIAGNOSIS — H43811 Vitreous degeneration, right eye: Secondary | ICD-10-CM | POA: Diagnosis not present

## 2015-05-21 DIAGNOSIS — M47816 Spondylosis without myelopathy or radiculopathy, lumbar region: Secondary | ICD-10-CM | POA: Diagnosis not present

## 2015-06-11 DIAGNOSIS — M47816 Spondylosis without myelopathy or radiculopathy, lumbar region: Secondary | ICD-10-CM | POA: Diagnosis not present

## 2015-06-18 DIAGNOSIS — M47816 Spondylosis without myelopathy or radiculopathy, lumbar region: Secondary | ICD-10-CM | POA: Diagnosis not present

## 2015-06-19 DIAGNOSIS — R635 Abnormal weight gain: Secondary | ICD-10-CM | POA: Diagnosis not present

## 2015-06-19 DIAGNOSIS — G5603 Carpal tunnel syndrome, bilateral upper limbs: Secondary | ICD-10-CM | POA: Diagnosis not present

## 2015-06-19 DIAGNOSIS — Z9884 Bariatric surgery status: Secondary | ICD-10-CM | POA: Diagnosis not present

## 2015-07-09 DIAGNOSIS — G5603 Carpal tunnel syndrome, bilateral upper limbs: Secondary | ICD-10-CM | POA: Diagnosis not present

## 2015-07-09 DIAGNOSIS — Z9889 Other specified postprocedural states: Secondary | ICD-10-CM | POA: Diagnosis not present

## 2015-07-09 DIAGNOSIS — G5623 Lesion of ulnar nerve, bilateral upper limbs: Secondary | ICD-10-CM | POA: Diagnosis not present

## 2015-07-23 DIAGNOSIS — M25552 Pain in left hip: Secondary | ICD-10-CM | POA: Diagnosis not present

## 2015-08-07 DIAGNOSIS — G5602 Carpal tunnel syndrome, left upper limb: Secondary | ICD-10-CM | POA: Diagnosis not present

## 2015-08-07 DIAGNOSIS — M25562 Pain in left knee: Secondary | ICD-10-CM | POA: Diagnosis not present

## 2015-08-07 DIAGNOSIS — M19041 Primary osteoarthritis, right hand: Secondary | ICD-10-CM | POA: Diagnosis not present

## 2015-08-07 DIAGNOSIS — M19042 Primary osteoarthritis, left hand: Secondary | ICD-10-CM | POA: Diagnosis not present

## 2015-08-07 DIAGNOSIS — G629 Polyneuropathy, unspecified: Secondary | ICD-10-CM | POA: Diagnosis not present

## 2015-08-07 DIAGNOSIS — G5601 Carpal tunnel syndrome, right upper limb: Secondary | ICD-10-CM | POA: Diagnosis not present

## 2015-08-15 DIAGNOSIS — R208 Other disturbances of skin sensation: Secondary | ICD-10-CM | POA: Diagnosis not present

## 2015-08-15 DIAGNOSIS — M25552 Pain in left hip: Secondary | ICD-10-CM | POA: Diagnosis not present

## 2015-08-23 DIAGNOSIS — G5621 Lesion of ulnar nerve, right upper limb: Secondary | ICD-10-CM | POA: Diagnosis not present

## 2015-08-23 DIAGNOSIS — Z79899 Other long term (current) drug therapy: Secondary | ICD-10-CM | POA: Diagnosis not present

## 2015-08-23 DIAGNOSIS — Z9889 Other specified postprocedural states: Secondary | ICD-10-CM | POA: Diagnosis not present

## 2015-08-23 DIAGNOSIS — G5622 Lesion of ulnar nerve, left upper limb: Secondary | ICD-10-CM | POA: Diagnosis not present

## 2015-09-19 DIAGNOSIS — M791 Myalgia: Secondary | ICD-10-CM | POA: Diagnosis not present

## 2015-09-19 DIAGNOSIS — M47898 Other spondylosis, sacral and sacrococcygeal region: Secondary | ICD-10-CM | POA: Diagnosis not present

## 2015-10-02 DIAGNOSIS — G8929 Other chronic pain: Secondary | ICD-10-CM | POA: Diagnosis not present

## 2015-10-02 DIAGNOSIS — M533 Sacrococcygeal disorders, not elsewhere classified: Secondary | ICD-10-CM | POA: Diagnosis not present

## 2015-12-08 ENCOUNTER — Encounter (HOSPITAL_COMMUNITY): Payer: Self-pay | Admitting: Emergency Medicine

## 2015-12-08 ENCOUNTER — Emergency Department (HOSPITAL_COMMUNITY)
Admission: EM | Admit: 2015-12-08 | Discharge: 2015-12-09 | Disposition: A | Discharge status: Home / Self Care | Payer: Medicare Other | Attending: Emergency Medicine | Admitting: Emergency Medicine

## 2015-12-08 ENCOUNTER — Emergency Department (HOSPITAL_COMMUNITY): Payer: Medicare Other

## 2015-12-08 DIAGNOSIS — M1990 Unspecified osteoarthritis, unspecified site: Secondary | ICD-10-CM | POA: Diagnosis not present

## 2015-12-08 DIAGNOSIS — I471 Supraventricular tachycardia: Secondary | ICD-10-CM | POA: Diagnosis not present

## 2015-12-08 DIAGNOSIS — R000 Tachycardia, unspecified: Secondary | ICD-10-CM | POA: Diagnosis present

## 2015-12-08 DIAGNOSIS — R079 Chest pain, unspecified: Secondary | ICD-10-CM | POA: Diagnosis not present

## 2015-12-08 DIAGNOSIS — Z79899 Other long term (current) drug therapy: Secondary | ICD-10-CM | POA: Diagnosis not present

## 2015-12-08 LAB — BASIC METABOLIC PANEL
Anion gap: 8 (ref 5–15)
BUN: 13 mg/dL (ref 6–20)
CHLORIDE: 108 mmol/L (ref 101–111)
CO2: 24 mmol/L (ref 22–32)
CREATININE: 0.67 mg/dL (ref 0.61–1.24)
Calcium: 8.1 mg/dL — ABNORMAL LOW (ref 8.9–10.3)
GFR calc non Af Amer: >60 mL/min (ref >60)
Glucose, Bld: 85 mg/dL (ref 65–99)
Potassium: 3.6 mmol/L (ref 3.5–5.1)
Sodium: 140 mmol/L (ref 135–145)

## 2015-12-08 LAB — CBC
HCT: 36.8 % — ABNORMAL LOW (ref 39.0–52.0)
Hemoglobin: 11.3 g/dL — ABNORMAL LOW (ref 13.0–17.0)
MCH: 27.5 pg (ref 26.0–34.0)
MCHC: 30.7 g/dL (ref 30.0–36.0)
MCV: 89.5 fL (ref 78.0–100.0)
PLATELETS: 210 10*3/uL (ref 150–400)
RBC: 4.11 MIL/uL — AB (ref 4.22–5.81)
RDW: 14.3 % (ref 11.5–15.5)
WBC: 6.3 10*3/uL (ref 4.0–10.5)

## 2015-12-08 LAB — I-STAT TROPONIN, ED: TROPONIN I, POC: 0.01 ng/mL (ref 0.00–0.08)

## 2015-12-08 NOTE — ED Provider Notes (Signed)
CSN: [id number]     Arrival date & time 12/08/15  2216 History   First MD Initiated Contact with Patient 12/08/15 2218     Chief Complaint  Patient presents with  . Tachycardia   HPI   65 -year-old male with no significant past medical history presents today with likely SVT. Patient reports that he was having a party at his house with greater than 100 people they're celebrating his daughters per day. He reports a stressful week leading up to the event. He notes he was feeling well until out of the blue he had sudden rapid heart rate, chest pain, shortness of breath and dizziness. Patient called EMS who arrived promptly and administered adenosine. 3 treatments were required with spontaneous resolution of his symptoms. At the time my evaluation patient reports that he's feeling well, has no chest pain, palpitations, shortness of breath, nausea, vomiting, fever or chills. Patient denies any preceding infectious etiology, he reports that he does not consume excessive amounts of caffeine, does not use any drugs. Patient denies any history of the same, denies any previous cardiac history, no significant family cardiac history. Patient does not take any daily medications.   Past Medical History  Diagnosis Date  . 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 Left patella fracture 07/19/2011  . Arthritis   . Closed intertrochanteric fracture of left hip (HCC) 07/20/2011  . Gout    Past Surgical History  Procedure Laterality Date  . Roux-en-y procedure  2008    carpal tunnell  l rcr  . Compression hip screw  07/20/2011    Procedure: COMPRESSION HIP;  Surgeon: Eulas Post, MD;  Location: [facility] OR;  Service: Orthopedics;  Laterality: Left;  . Knee surgery     Family History  Problem Relation Age of Onset  . Heart disease Father     Died in his 86s of a heart attack   Social History  Substance Use Topics  . Smoking status: Never Smoker   . Smokeless tobacco: Never Used  . Alcohol Use: Yes    Review of  Systems  All other systems reviewed and are negative.   Allergies  Review of patient's allergies indicates no known allergies.  Home Medications   Prior to Admission medications   Medication Sig Start Date End Date Taking? Authorizing Provider  gabapentin (NEURONTIN) 300 MG capsule Take 600 mg by mouth 2 (two) times daily.   Yes Historical Provider, MD  traMADol (ULTRAM) 50 MG tablet Take 1 tablet (50 mg total) by mouth every 6 (six) hours as needed. For pain 07/05/13  Yes Samara Deist P Egerton, DPM   BP 116/82 mmHg  Pulse 76  Temp(Src) 97.7 F (36.5 C) (Oral)  Resp 9  SpO2 99%   Physical Exam  Constitutional: He is oriented to person, place, and time. He appears well-developed and well-nourished.  HENT:  Head: Normocephalic and atraumatic.  Eyes: Conjunctivae are normal. Pupils are equal, round, and reactive to light. Right eye exhibits no discharge. Left eye exhibits no discharge. No scleral icterus.  Neck: Normal range of motion. No JVD present. No tracheal deviation present.  Cardiovascular: Normal rate, regular rhythm, normal heart sounds and intact distal pulses.  Exam reveals no gallop and no friction rub.   No murmur heard. Pulmonary/Chest: Effort normal and breath sounds normal. No stridor. No respiratory distress. He has no wheezes. He has no rales. He exhibits no tenderness.  Musculoskeletal: Normal range of motion. He exhibits no edema or tenderness.  Neurological: He is  alert and oriented to person, place, and time. Coordination normal.  Skin: Skin is warm and dry. No rash noted. No erythema. No pallor.  Psychiatric: He has a normal mood and affect. His behavior is normal. Judgment and thought content normal.  Nursing note and vitals reviewed.   ED Course  Procedures (including critical care time) Labs Review Labs Reviewed  BASIC METABOLIC PANEL - Abnormal; Notable for the following:    Calcium 8.1 (*)    All other components within normal limits  CBC - Abnormal;  Notable for the following:    RBC 4.11 (*)    Hemoglobin 11.3 (*)    HCT 36.8 (*)    All other components within normal limits  Rosezena Sensor, ED    Imaging Review Dg Chest 2 View  12/08/2015  CLINICAL DATA:  Acute onset of diaphoresis and generalized weakness. Generalized chest pain. Heart palpitations. Initial encounter. EXAM: CHEST  2 VIEW COMPARISON:  Chest radiograph from 07/19/2011 FINDINGS: The lungs are well-aerated. Mild vascular congestion is noted. Mild bibasilar atelectasis is seen. Mild peribronchial thickening is noted. There is no evidence of pleural effusion or pneumothorax. The heart is normal in size; the mediastinal contour is within normal limits. No acute osseous abnormalities are seen. IMPRESSION: Mild vascular congestion noted. Mild bibasilar atelectasis seen. Mild peribronchial thickening noted. Electronically Signed   By: Roanna Raider M.D.   On: 12/08/2015 22:58   I have personally reviewed and evaluated these images and lab results as part of my medical decision-making.   EKG Interpretation None      ED ECG REPORT   Date: 12/09/2015  Rate: 73  Rhythm: normal sinus rhythm  QRS Axis: normal  Intervals: normal  ST/T Wave abnormalities: normal  Conduction Disutrbances:none  Narrative Interpretation:   Old EKG Reviewed: none available  I have personally reviewed the EKG tracing and agree with the computerized printout as noted. MDM   Final diagnoses:  SVT (supraventricular tachycardia) (HCC)    Labs:I-STAT troponin, BMP, CBC  Imaging: DG chest 2 view  Consults:  Therapeutics:  Discharge Meds:   Assessment/Plan: 65 year old male presents today with SVT. Symptoms started prior to arrival, EMS successfully treated with adenosine. Patient was monitored here in the ED, was asymptomatic with no complaints. Patient has no findings that would necessitate further evaluation and management here in the ED. Patient was extensively counseled on SVT,  encouraged to contact EMS immediately if symptoms return, Valsalva instructions given. Patient is encouraged contact cardiologist tomorrow morning and schedule follow-up evaluation. Patient verbalizes understanding and agreement to today's plan and had no further questions or concerns        Eyvonne Mechanic, PA-C 12/09/15 0020  Alvira Monday, MD 12/09/15 1253  Alvira Monday, MD 12/09/15 1255

## 2015-12-08 NOTE — ED Notes (Signed)
Pt arrived to ED via EMS. Became diaphoretic and weak, c/o chest pain at 10 on scale of 0-10. Heart palpitations. EKG showed sinus V-Tach. Aspirin 324 mg provided by fire department. Adenosine 6mg  given at 21:34.  Adenosine 12 mg given at 21:38. SVT converted. Vital signs within normal upon ED arrival. No current complaints of chest pain. O2 applied.

## 2015-12-09 NOTE — Discharge Instructions (Signed)
Please monitor for a return of symptoms. If they return please call EMS immediately. Please attempt valsalva. Please follow up with cardiologist for further evaluation.

## 2015-12-10 IMAGING — DX DG FOOT COMPLETE 3+V*L*
3 series · 3 of 3 positions shown · non-contrast
Comparison: None.

CLINICAL DATA: Acute left foot pain after tool dropped on it.
Initial encounter.

EXAM:
LEFT FOOT - COMPLETE 3+ VIEW

[foot ap]
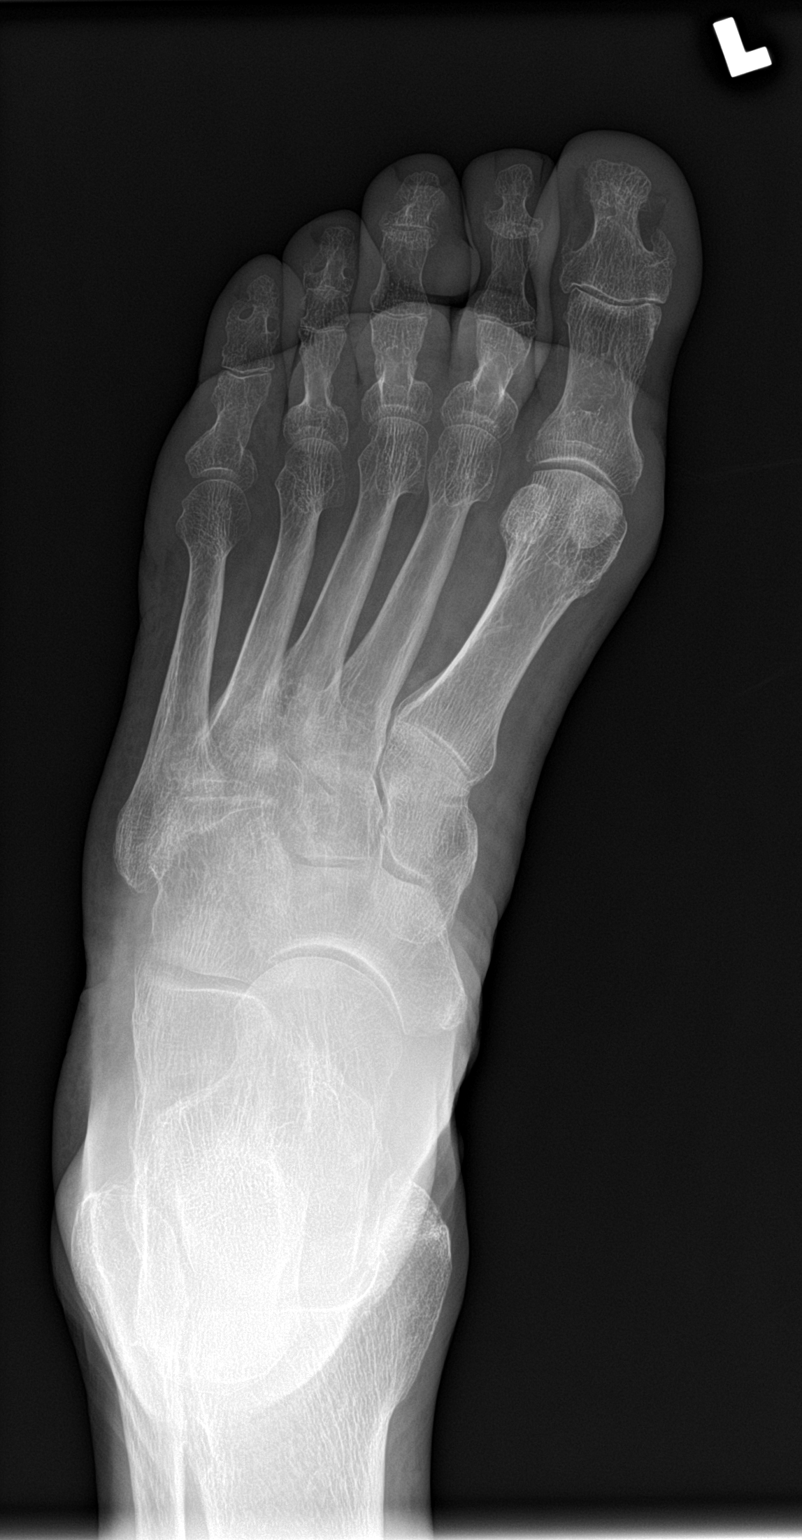

[foot obl]
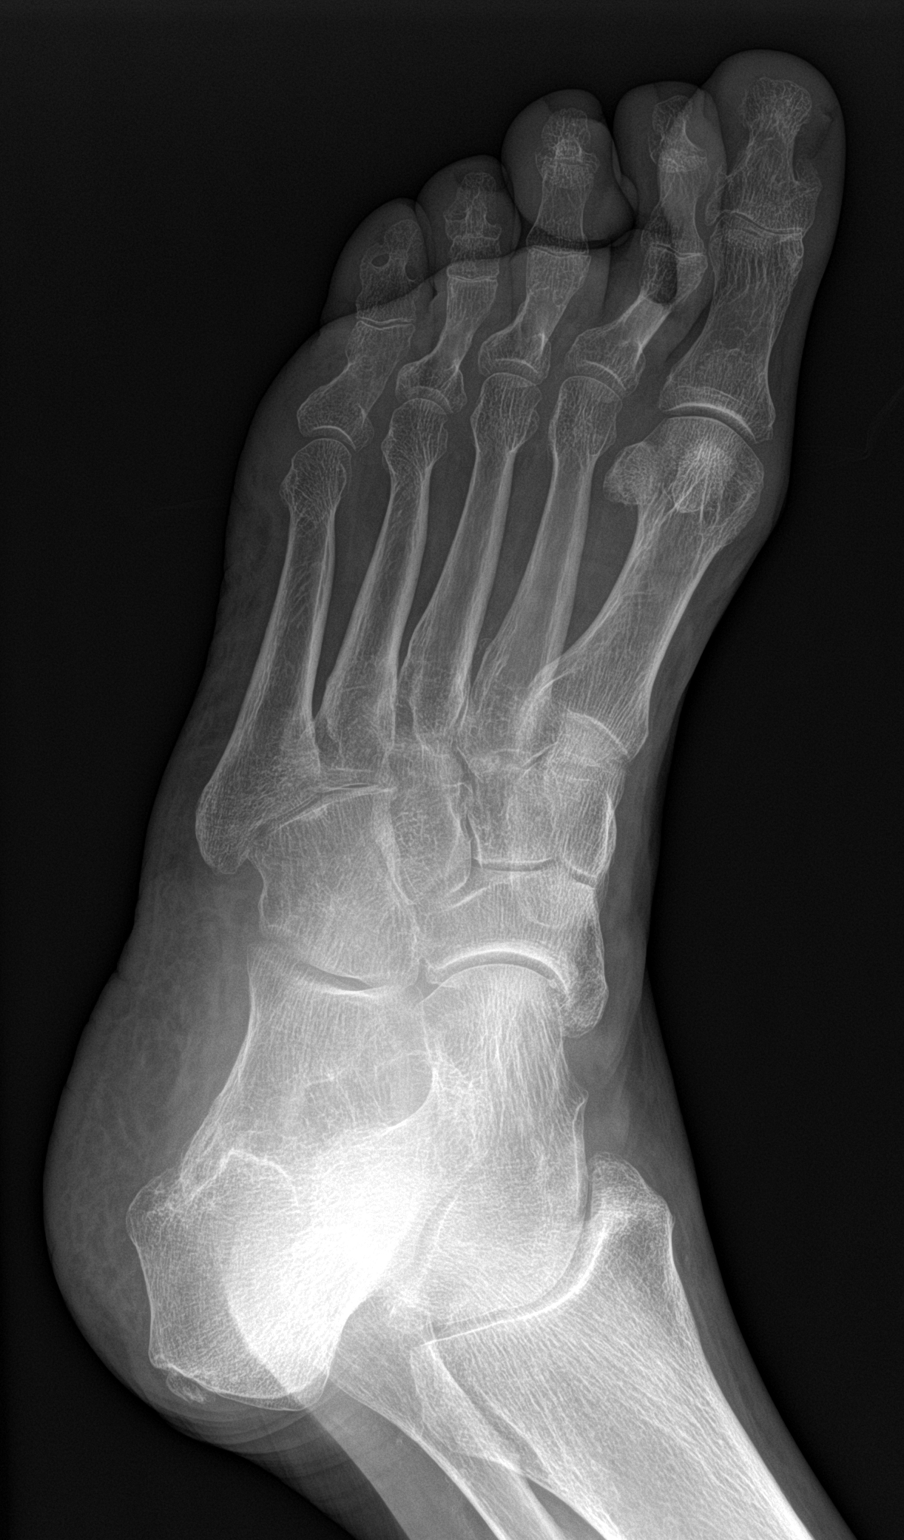

[foot lat]
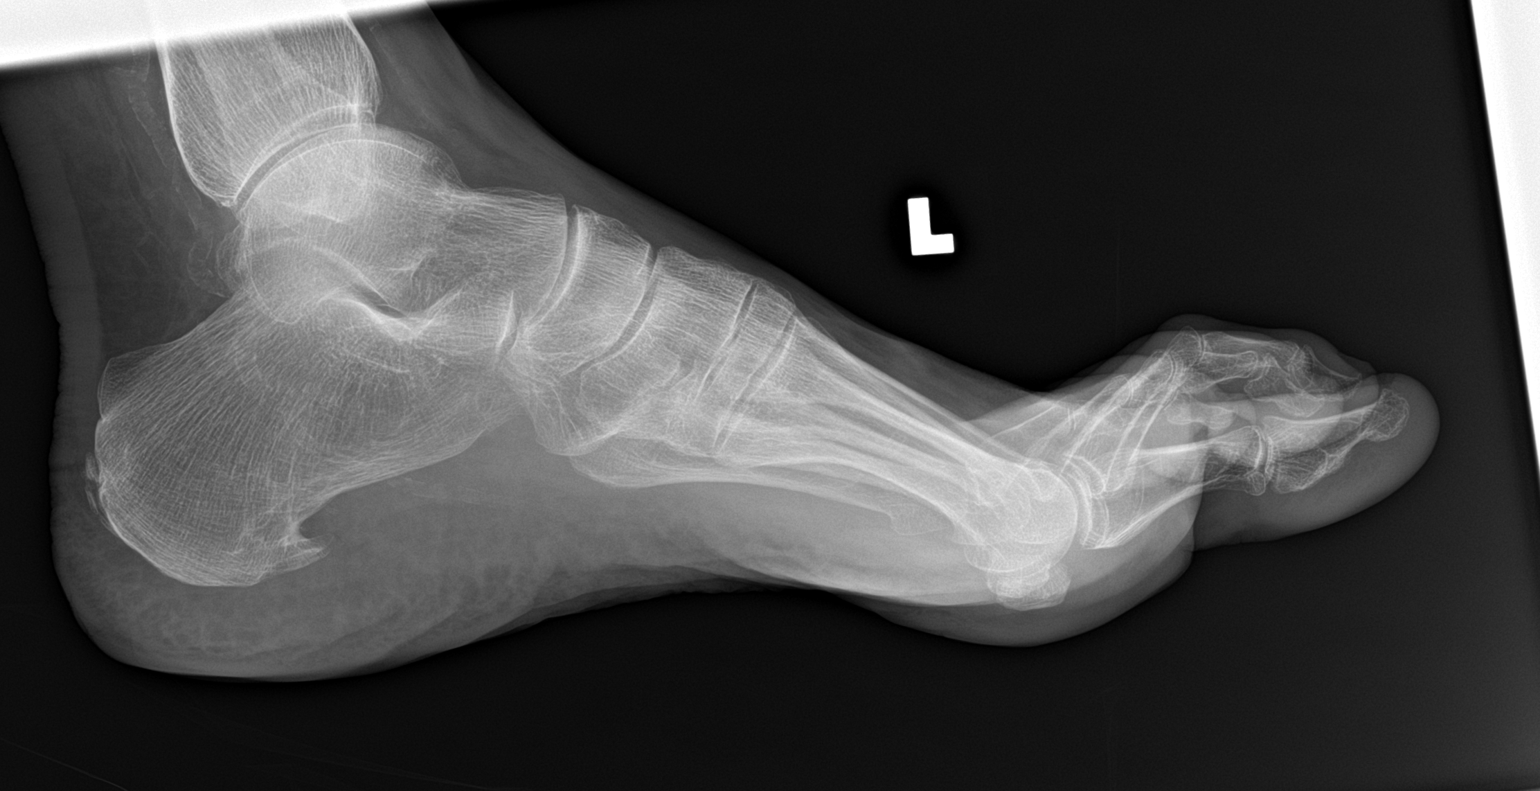

[3 of 3 positions shown; findings below may reference images not displayed]

FINDINGS: There is no evidence of fracture or dislocation. Spurring of
posterior calcaneus is noted. Joint spaces are intact. Vascular
calcifications are noted.
IMPRESSION: No acute abnormality seen in the left foot.

## 2015-12-19 ENCOUNTER — Encounter: Payer: Self-pay | Admitting: *Deleted

## 2015-12-19 ENCOUNTER — Ambulatory Visit (INDEPENDENT_AMBULATORY_CARE_PROVIDER_SITE_OTHER): Payer: Medicare Other | Admitting: Internal Medicine

## 2015-12-19 ENCOUNTER — Encounter: Payer: Self-pay | Admitting: Internal Medicine

## 2015-12-19 VITALS — BP 112/62 | HR 82 | Ht 72.0 in | Wt 208.6 lb

## 2015-12-19 DIAGNOSIS — I471 Supraventricular tachycardia: Secondary | ICD-10-CM

## 2015-12-19 DIAGNOSIS — Z01812 Encounter for preprocedural laboratory examination: Secondary | ICD-10-CM

## 2015-12-19 NOTE — Progress Notes (Signed)
    HPI [name] is referred today for evaluation of SVT. The patient is a very pleasant 64 yo man with a h/o morbid obesity, s/p remote gastric bypass. He has lost over a 100 lbs. The patient has a h/o palpitations which would start and stop suddenly and resolve with requiring medical therapy. The patient called 911 when he had an episode where he his palpitations would not stop and he was found to be in SVT at 190/min. He was treated with adenosine three times before the SVT terminated. The patient had associated sob, chest pressure and near syncope. He was weak for several days after the episode.  No Known Allergies   Current Outpatient Prescriptions  Medication Sig Dispense Refill  . gabapentin (NEURONTIN) 300 MG capsule Take 600 mg by mouth 2 (two) times daily.    . traMADol (ULTRAM) 50 MG tablet Take 1 tablet (50 mg total) by mouth every 6 (six) hours as needed. For pain 30 tablet 1   No current facility-administered medications for this visit.     Past Medical History  Diagnosis Date  . 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 Left patella fracture 07/19/2011  . Arthritis   . Closed intertrochanteric fracture of left hip (HCC) 07/20/2011  . Gout     ROS:   All systems reviewed and negative except as noted in the HPI.   Past Surgical History  Procedure Laterality Date  . Roux-en-y procedure  2008    carpal tunnell  l rcr  . Compression hip screw  07/20/2011    Procedure: COMPRESSION HIP;  Surgeon: Joshua P Landau, MD;  Location: [facility] OR;  Service: Orthopedics;  Laterality: Left;  . Knee surgery       Family History  Problem Relation Age of Onset  . Heart disease Father     Died in his 80s of a heart attack     Social History   Social History  . Marital Status: Married    Spouse Name: N/A  . Number of Children: N/A  . Years of Education: N/A   Occupational History  . [employer] Employ[employer]   Social History Main Topics  . Smoking status: Never  Smoker   . Smokeless tobacco: Never Used  . Alcohol Use: Yes  . Drug Use: No  . Sexual Activity: Not on file   Other Topics Concern  . Not on file   Social History Narrative     BP 112/62 mmHg  Pulse 82  Ht 6' (1.829 m)  Wt 208 lb 9.6 oz (94.62 kg)  BMI 28.28 kg/m2  SpO2 96%  Physical Exam:  Well appearing 64 yo man, NAD HEENT: Unremarkable Neck:  6 cm JVD, no thyromegally Lymphatics:  No adenopathy Back:  No CVA tenderness Lungs:  Clear with no wheezes HEART:  Regular rate rhythm, no murmurs, no rubs, no clicks Abd:  soft, positive bowel sounds, no organomegally, no rebound, no guarding Ext:  2 plus pulses, no edema, no cyanosis, no clubbing Skin:  No rashes no nodules Neuro:  CN II through XII intact, motor grossly intact  EKG - nsr with no pre-excitation.  Old ECG demonstrates a short RP tachycardia at 190/min  Assess/Plan: 1. SVT - I discussed the treatment options with the patient. The risks/benefits/goals/expectations of EP study and catheter ablation were reviewed and he wishes to proceed. We will schedule in several weeks. 2. Obesity - he is minimally overweight having lost over a 100 lbs from   bariatric surgery. 3. HTN - his blood pressure is well controlled.   Leonia Reeves.D.

## 2015-12-19 NOTE — Patient Instructions (Addendum)
Medication Instructions:  Your physician recommends that you continue on your current medications as directed. Please refer to the Current Medication list given to you today.  Labwork: Your physician recommends that you return for pre procedure lab work between July 4-7  Testing/Procedures: Your physician has recommended that you have an SVT ablation. Catheter ablation is a medical procedure used to treat some cardiac arrhythmias (irregular heartbeats). During catheter ablation, a long, thin, flexible tube is put into a blood vessel in your groin (upper thigh), or neck. This tube is called an ablation catheter. It is then guided to your heart through the blood vessel. Radio frequency waves destroy small areas of heart tissue where abnormal heartbeats may cause an arrhythmia to start. Please see the instruction sheet given to you today.  Follow-Up: Your physician recommends that you schedule a follow-up appointment in: 4 weeks, after your procedure on 01/08/16, with [name].  If you need a refill on your cardiac medications before your next appointment, please call your pharmacy.  Thank you for choosing [facility]!!    Any Other Special Instructions Will Be Listed Below (If Applicable). Cardiac Ablation Cardiac ablation is a procedure to disable a small amount of heart tissue in very specific places. The heart has many electrical connections. Sometimes these connections are abnormal and can cause the heart to beat very fast or irregularly. By disabling some of the problem areas, heart rhythm can be improved or made normal. Ablation is done for people who:   Have Wolff-Parkinson-White syndrome.   Have other fast heart rhythms (tachycardia).   Have taken medicines for an abnormal heart rhythm (arrhythmia) that resulted in:   No success.   Side effects.   May have a high-risk heartbeat that could result in death.  LET [facility] CARE PROVIDER KNOW ABOUT:   Any allergies  you have or any previous reactions you have had to X-ray dye, food (such as seafood), medicine, or tape.   All medicines you are taking, including vitamins, herbs, eye drops, creams, and over-the-counter medicines.   Previous problems you or members of your family have had with the use of anesthetics.   Any blood disorders you have.   Previous surgeries or procedures (such as a kidney transplant) you have had.   Medical conditions you have (such as kidney failure).  RISKS AND COMPLICATIONS Generally, cardiac ablation is a safe procedure. However, problems can occur and include:   Increased risk of cancer. Depending on how long it takes to do the ablation, the dose of radiation can be high.  Bruising and bleeding where a thin, flexible tube (catheter) was inserted during the procedure.   Bleeding into the chest, especially into the sac that surrounds the heart (serious).  Need for a permanent pacemaker if the normal electrical system is damaged.   The procedure may not be fully effective, and this may not be recognized for months. Repeat ablation procedures are sometimes required. BEFORE THE PROCEDURE   Follow any instructions from your health care provider regarding eating and drinking before the procedure.   Take your medicines as directed at regular times with water, unless instructed otherwise by your health care provider. If you are taking diabetes medicine, including insulin, ask how you are to take it and if there are any special instructions you should follow. It is common to adjust insulin dosing the day of the ablation.  PROCEDURE  An ablation is usually performed in a catheterization laboratory with the guidance of fluoroscopy. Fluoroscopy  is a type of X-ray that helps your health care provider see images of your heart during the procedure.   An ablation is a minimally invasive procedure. This means a small cut (incision) is made in either your neck or groin.  Your health care provider will decide where to make the incision based on your medical history and physical exam.  An IV tube will be started before the procedure begins. You will be given an anesthetic or medicine to help you relax (sedative).  The skin on your neck or groin will be numbed. A needle will be inserted into a large vein in your neck or groin and catheters will be threaded to your heart.  A special dye that shows up on fluoroscopy pictures may be injected through the catheter. The dye helps your health care provider see the area of the heart that needs treatment.  The catheter has electrodes on the tip. When the area of heart tissue that is causing the arrhythmia is found, the catheter tip will send an electrical current to the area and "scar" the tissue. Three types of energy can be used to ablate the heart tissue:   Heat (radiofrequency energy).   Laser energy.   Extreme cold (cryoablation).   When the area of the heart has been ablated, the catheter will be taken out. Pressure will be held on the insertion site. This will help the insertion site clot and keep it from bleeding. A bandage will be placed on the insertion site.  AFTER THE PROCEDURE   After the procedure, you will be taken to a recovery area where your vital signs (blood pressure, heart rate, and breathing) will be monitored. The insertion site will also be monitored for bleeding.   You will need to lie still for 4-6 hours. This is to ensure you do not bleed from the catheter insertion site.    This information is not intended to replace advice given to you by your health care provider. Make sure you discuss any questions you have with your health care provider.   Document Released: 11/01/2008 Document Revised: 07/06/2014 Document Reviewed: 11/07/2012 Elsevier Interactive Patient Education [employer]

## 2016-01-03 ENCOUNTER — Other Ambulatory Visit (INDEPENDENT_AMBULATORY_CARE_PROVIDER_SITE_OTHER): Payer: Medicare Other | Admitting: *Deleted

## 2016-01-03 DIAGNOSIS — I471 Supraventricular tachycardia: Secondary | ICD-10-CM

## 2016-01-03 DIAGNOSIS — Z01812 Encounter for preprocedural laboratory examination: Secondary | ICD-10-CM

## 2016-01-03 LAB — CBC WITH DIFFERENTIAL/PLATELET
BASOS PCT: 0 %
Basophils Absolute: 0 cells/uL (ref 0–200)
EOS PCT: 4 %
Eosinophils Absolute: 200 cells/uL (ref 15–500)
HEMATOCRIT: 35.8 % — AB (ref 38.5–50.0)
HEMOGLOBIN: 11.8 g/dL — AB (ref 13.2–17.1)
LYMPHS ABS: 1800 {cells}/uL (ref 850–3900)
Lymphocytes Relative: 36 %
MCH: 28.6 pg (ref 27.0–33.0)
MCHC: 33 g/dL (ref 32.0–36.0)
MCV: 86.9 fL (ref 80.0–100.0)
MONO ABS: 650 {cells}/uL (ref 200–950)
MPV: 10.1 fL (ref 7.5–12.5)
Monocytes Relative: 13 %
NEUTROS PCT: 47 %
Neutro Abs: 2350 cells/uL (ref 1500–7800)
Platelets: 211 10*3/uL (ref 140–400)
RBC: 4.12 MIL/uL — AB (ref 4.20–5.80)
RDW: 14.8 % (ref 11.0–15.0)
WBC: 5 10*3/uL (ref 3.8–10.8)

## 2016-01-03 LAB — BASIC METABOLIC PANEL
BUN: 14 mg/dL (ref 7–25)
CHLORIDE: 106 mmol/L (ref 98–110)
CO2: 26 mmol/L (ref 20–31)
Calcium: 8.3 mg/dL — ABNORMAL LOW (ref 8.6–10.3)
Creat: 0.59 mg/dL — ABNORMAL LOW (ref 0.70–1.25)
GLUCOSE: 189 mg/dL — AB (ref 65–99)
POTASSIUM: 4 mmol/L (ref 3.5–5.3)
SODIUM: 141 mmol/L (ref 135–146)

## 2016-01-03 NOTE — Addendum Note (Signed)
Addended by: Tonita Phoenix on: 01/03/2016 08:19 AM   Modules accepted: Orders

## 2016-01-08 ENCOUNTER — Ambulatory Visit (HOSPITAL_COMMUNITY)
Admission: RE | Admit: 2016-01-08 | Discharge: 2016-01-08 | Disposition: A | Discharge status: Home / Self Care | Payer: Medicare Other | Source: Ambulatory Visit | Attending: Internal Medicine | Admitting: Internal Medicine

## 2016-01-08 ENCOUNTER — Encounter (HOSPITAL_COMMUNITY)
Admission: RE | Disposition: A | Discharge status: Home / Self Care | Payer: Self-pay | Source: Ambulatory Visit | Attending: Internal Medicine

## 2016-01-08 ENCOUNTER — Encounter (HOSPITAL_COMMUNITY): Payer: Self-pay | Admitting: Internal Medicine

## 2016-01-08 DIAGNOSIS — I471 Supraventricular tachycardia, unspecified: Secondary | ICD-10-CM | POA: Diagnosis not present

## 2016-01-08 DIAGNOSIS — Z9884 Bariatric surgery status: Secondary | ICD-10-CM | POA: Diagnosis not present

## 2016-01-08 DIAGNOSIS — M1990 Unspecified osteoarthritis, unspecified site: Secondary | ICD-10-CM | POA: Insufficient documentation

## 2016-01-08 DIAGNOSIS — M109 Gout, unspecified: Secondary | ICD-10-CM | POA: Insufficient documentation

## 2016-01-08 DIAGNOSIS — Z8249 Family history of ischemic heart disease and other diseases of the circulatory system: Secondary | ICD-10-CM | POA: Diagnosis not present

## 2016-01-08 DIAGNOSIS — E669 Obesity, unspecified: Secondary | ICD-10-CM | POA: Insufficient documentation

## 2016-01-08 DIAGNOSIS — I10 Essential (primary) hypertension: Secondary | ICD-10-CM | POA: Diagnosis not present

## 2016-01-08 DIAGNOSIS — Z6828 Body mass index (BMI) 28.0-28.9, adult: Secondary | ICD-10-CM | POA: Diagnosis not present

## 2016-01-08 HISTORY — PX: ELECTROPHYSIOLOGIC STUDY: SHX172A

## 2016-01-08 SURGERY — A-FLUTTER/A-TACH/SVT ABLATION
Anesthesia: LOCAL

## 2016-01-08 MED ORDER — SODIUM CHLORIDE 0.9 % IV SOLN
1.0000 mg | INTRAVENOUS | Status: DC | PRN
  Administered 2016-01-08: 4 ug/min via INTRAVENOUS

## 2016-01-08 MED ORDER — MIDAZOLAM HCL 5 MG/5ML IJ SOLN
INTRAMUSCULAR | Status: AC
  Filled 2016-01-08: qty 5

## 2016-01-08 MED ORDER — ISOPROTERENOL HCL 0.2 MG/ML IJ SOLN
INTRAMUSCULAR | Status: AC
  Filled 2016-01-08: qty 5

## 2016-01-08 MED ORDER — MIDAZOLAM HCL 5 MG/5ML IJ SOLN
INTRAMUSCULAR | Status: DC | PRN
  Administered 2016-01-08: 2 mg via INTRAVENOUS
  Administered 2016-01-08 (×4): 1 mg via INTRAVENOUS
  Administered 2016-01-08: 2 mg via INTRAVENOUS

## 2016-01-08 MED ORDER — SODIUM CHLORIDE 0.9 % IV SOLN: 250.0000 mL | INTRAVENOUS | Status: DC | PRN

## 2016-01-08 MED ORDER — SODIUM CHLORIDE 0.9% FLUSH
3.0000 mL | Freq: Two times a day (BID) | INTRAVENOUS | Status: DC
  Administered 2016-01-08: 3 mL via INTRAVENOUS

## 2016-01-08 MED ORDER — FENTANYL CITRATE (PF) 100 MCG/2ML IJ SOLN
INTRAMUSCULAR | Status: DC | PRN
  Administered 2016-01-08: 12.5 ug via INTRAVENOUS
  Administered 2016-01-08: 25 ug via INTRAVENOUS
  Administered 2016-01-08 (×2): 12.5 ug via INTRAVENOUS
  Administered 2016-01-08: 25 ug via INTRAVENOUS
  Administered 2016-01-08: 12.5 ug via INTRAVENOUS

## 2016-01-08 MED ORDER — SODIUM CHLORIDE 0.9 % IV SOLN
INTRAVENOUS | Status: DC
  Administered 2016-01-08: 07:00:00 via INTRAVENOUS

## 2016-01-08 MED ORDER — BUPIVACAINE HCL (PF) 0.25 % IJ SOLN
INTRAMUSCULAR | Status: DC | PRN
  Administered 2016-01-08: 45 mL

## 2016-01-08 MED ORDER — BUPIVACAINE HCL (PF) 0.25 % IJ SOLN
INTRAMUSCULAR | Status: AC
  Filled 2016-01-08: qty 30

## 2016-01-08 MED ORDER — FENTANYL CITRATE (PF) 100 MCG/2ML IJ SOLN
INTRAMUSCULAR | Status: AC
  Filled 2016-01-08: qty 2

## 2016-01-08 MED ORDER — ONDANSETRON HCL 4 MG/2ML IJ SOLN: 4.0000 mg | Freq: Four times a day (QID) | INTRAMUSCULAR | Status: DC | PRN

## 2016-01-08 MED ORDER — HEPARIN (PORCINE) IN NACL 2-0.9 UNIT/ML-% IJ SOLN
INTRAMUSCULAR | Status: DC | PRN
  Administered 2016-01-08: 500 mL

## 2016-01-08 MED ORDER — SODIUM CHLORIDE 0.9% FLUSH: 3.0000 mL | INTRAVENOUS | Status: DC | PRN

## 2016-01-08 MED ORDER — ACETAMINOPHEN 325 MG PO TABS: 650.0000 mg | ORAL_TABLET | ORAL | Status: DC | PRN

## 2016-01-08 SURGICAL SUPPLY — 11 items
BAG SNAP BAND KOVER 36X36 (MISCELLANEOUS) ×1 IMPLANT
CATH BLAZER 7FR 4MM LG 5031TK2 (ABLATOR) ×1 IMPLANT
CATH HEX JOS 2-5-2 65CM 6F REP (CATHETERS) ×1 IMPLANT
CATH JOSEPHSON QUAD-ALLRED 6FR (CATHETERS) ×2 IMPLANT
PACK EP LATEX FREE (CUSTOM PROCEDURE TRAY) ×2
PACK EP LF (CUSTOM PROCEDURE TRAY) IMPLANT
PAD DEFIB LIFELINK (PAD) ×1 IMPLANT
SHEATH PINNACLE 6F 10CM (SHEATH) ×2 IMPLANT
SHEATH PINNACLE 7F 10CM (SHEATH) ×1 IMPLANT
SHEATH PINNACLE 8F 10CM (SHEATH) ×1 IMPLANT
SHIELD RADPAD SCOOP 12X17 (MISCELLANEOUS) ×1 IMPLANT

## 2016-01-08 NOTE — H&P (View-Only) (Signed)
HPI [name] is referred today for evaluation of SVT. The patient is a very pleasant 65 yo man with a h/o morbid obesity, s/p remote gastric bypass. He has lost over a 100 lbs. The patient has a h/o palpitations which would start and stop suddenly and resolve with requiring medical therapy. The patient called 911 when he had an episode where he his palpitations would not stop and he was found to be in SVT at 190/min. He was treated with adenosine three times before the SVT terminated. The patient had associated sob, chest pressure and near syncope. He was weak for several days after the episode.  No Known Allergies   Current Outpatient Prescriptions  Medication Sig Dispense Refill  . gabapentin (NEURONTIN) 300 MG capsule Take 600 mg by mouth 2 (two) times daily.    . traMADol (ULTRAM) 50 MG tablet Take 1 tablet (50 mg total) by mouth every 6 (six) hours as needed. For pain 30 tablet 1   No current facility-administered medications for this visit.     Past Medical History  Diagnosis Date  . 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 Left patella fracture 07/19/2011  . Arthritis   . Closed intertrochanteric fracture of left hip (HCC) 07/20/2011  . Gout     ROS:   All systems reviewed and negative except as noted in the HPI.   Past Surgical History  Procedure Laterality Date  . Roux-en-y procedure  2008    carpal tunnell  l rcr  . Compression hip screw  07/20/2011    Procedure: COMPRESSION HIP;  Surgeon: Eulas Post, MD;  Location: [facility] OR;  Service: Orthopedics;  Laterality: Left;  . Knee surgery       Family History  Problem Relation Age of Onset  . Heart disease Father     Died in his 68s of a heart attack     Social History   Social History  . Marital Status: Married    Spouse Name: N/A  . Number of Children: N/A  . Years of Education: N/A   Occupational History  . 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 Smoking status: Never  Smoker   . Smokeless tobacco: Never Used  . Alcohol Use: Yes  . Drug Use: No  . Sexual Activity: Not on file   Other Topics Concern  . Not on file   Social History Narrative     BP 112/62 mmHg  Pulse 82  Ht 6' (1.829 m)  Wt 208 lb 9.6 oz (94.62 kg)  BMI 28.28 kg/m2  SpO2 96%  Physical Exam:  Well appearing 65 yo man, NAD HEENT: Unremarkable Neck:  6 cm JVD, no thyromegally Lymphatics:  No adenopathy Back:  No CVA tenderness Lungs:  Clear with no wheezes HEART:  Regular rate rhythm, no murmurs, no rubs, no clicks Abd:  soft, positive bowel sounds, no organomegally, no rebound, no guarding Ext:  2 plus pulses, no edema, no cyanosis, no clubbing Skin:  No rashes no nodules Neuro:  CN II through XII intact, motor grossly intact  EKG - nsr with no pre-excitation.  Old ECG demonstrates a short RP tachycardia at 190/min  Assess/Plan: 1. SVT - I discussed the treatment options with the patient. The risks/benefits/goals/expectations of EP study and catheter ablation were reviewed and he wishes to proceed. We will schedule in several weeks. 2. Obesity - he is minimally overweight having lost over a 100 lbs from  bariatric surgery. 3. HTN - his blood pressure is well controlled.   Leonia Reeves.D.

## 2016-01-08 NOTE — Progress Notes (Signed)
Site area: right groin a 6,8 venous sheath was removed  Site Prior to Removal:  Level 0  Pressure Applied For 15 MINUTES    Bedrest Beginning at 1130am  Manual:   Yes.    Patient Status During Pull:  stable  Post Pull Groin Site:  Level 0  Post Pull Instructions Given:  Yes.    Post Pull Pulses Present:  Yes.    Dressing Applied:  Yes.    Comments:  VS remain stable during sheath pull.

## 2016-01-08 NOTE — Interval H&P Note (Signed)
History and Physical Interval Note:  01/08/2016 9:06 AM  [name].  has presented today for surgery, with the diagnosis of svt  The various methods of treatment have been discussed with the patient and family. After consideration of risks, benefits and other options for treatment, the patient has consented to  Procedure(s): /SVT Ablation (N/A) as a surgical intervention .  The patient's history has been reviewed, patient examined, no change in status, stable for surgery.  I have reviewed the patient's chart and labs.  Questions were answered to the patient's satisfaction.     Lewayne Bunting

## 2016-01-08 NOTE — Progress Notes (Signed)
Site area:  Right Internal Jugular a 7 french venous sheath.  Site Prior to Removal:  Level 0  Pressure Applied For15 MINUTES    Bedrest Beginning at 11445am Manual:   Yes.    Patient Status During Pull:  Stable  Post Pull Groin Site:  Level 0  Post Pull Instructions Given:  Yes.    Post Pull Pulses Present:  Yes.    Dressing Applied:  Yes.    Comments:  Vital sign remain stable during sheath pull.

## 2016-01-08 NOTE — Progress Notes (Addendum)
Patient is seen post-procedure Procedure sites stable, no bleeding, no hematoma, no pain at either site Telemetry is SR 70's Vitals are stable Patient denies any complaints Activity restrictions are reviewed with the patient  Follow up visit is arranged [name] has cleared the patient for discharge once his bedrest is completed and if groin stable after ambulation.  [name], PA-C  EP Attending  Agree with above. Stable for DC.  [name].D.

## 2016-01-08 NOTE — Discharge Instructions (Signed)
No driving for 1 week. No lifting over 5 lbs for 1 week. No sexual or vigorous activity for 1 week. You may return to work on 01/15/16. Keep procedure site clean & dry. If you notice increased pain, swelling, bleeding or pus, call/return!  You may shower, but no soaking baths/hot tubs/pools for 1 week.

## 2016-01-08 NOTE — Progress Notes (Signed)
Patient discharge paperwork gone over with patient and wife. All questions answered to patient satisfaction. Walked with patient in the halls, patient has no complaints or issues with walking. Patient iv removed intact, telemetry discontinued. Patient discharged home with wife by way of wheelchair.

## 2016-01-16 ENCOUNTER — Telehealth: Payer: Self-pay | Admitting: Internal Medicine

## 2016-01-16 NOTE — Telephone Encounter (Signed)
Returned call to patient and he is concerned because of the knot in his groin and the bruising.  I let him know that both are normal.  It may take some time for the bruising to dissipate.  He is going to the beach this weekend and just wanted to make sure this is okay prior to him going out of town.  He says the area is tender but he is able to move around without difficulty.  He does not have any unusual swelling.  He will call back if any thing changes and keep follow up as scheduled.

## 2016-01-16 NOTE — Telephone Encounter (Signed)
New Message:   Pt had Cath on 01-08-16,he says he is black and blue in his groin a knot and al ittle swelling in his groin area. He wants to know if this is normal?

## 2016-01-22 ENCOUNTER — Encounter: Payer: Self-pay | Admitting: *Deleted

## 2016-02-02 ENCOUNTER — Encounter (HOSPITAL_BASED_OUTPATIENT_CLINIC_OR_DEPARTMENT_OTHER): Payer: Self-pay | Admitting: Emergency Medicine

## 2016-02-02 ENCOUNTER — Emergency Department (HOSPITAL_BASED_OUTPATIENT_CLINIC_OR_DEPARTMENT_OTHER)
Admission: EM | Admit: 2016-02-02 | Discharge: 2016-02-02 | Disposition: A | Discharge status: Home / Self Care | Payer: Medicare Other | Attending: Emergency Medicine | Admitting: Emergency Medicine

## 2016-02-02 DIAGNOSIS — W208XXA Other cause of strike by thrown, projected or falling object, initial encounter: Secondary | ICD-10-CM | POA: Insufficient documentation

## 2016-02-02 DIAGNOSIS — Y939 Activity, unspecified: Secondary | ICD-10-CM | POA: Diagnosis not present

## 2016-02-02 DIAGNOSIS — Y929 Unspecified place or not applicable: Secondary | ICD-10-CM | POA: Insufficient documentation

## 2016-02-02 DIAGNOSIS — S0181XA Laceration without foreign body of other part of head, initial encounter: Secondary | ICD-10-CM | POA: Diagnosis not present

## 2016-02-02 DIAGNOSIS — Y999 Unspecified external cause status: Secondary | ICD-10-CM | POA: Insufficient documentation

## 2016-02-02 MED ORDER — LIDOCAINE-EPINEPHRINE-TETRACAINE (LET) SOLUTION
3.0000 mL | Freq: Once | NASAL | Status: AC
  Administered 2016-02-02: 3 mL via TOPICAL
  Filled 2016-02-02: qty 3

## 2016-02-02 MED ORDER — LIDOCAINE-EPINEPHRINE (PF) 2 %-1:200000 IJ SOLN
20.0000 mL | Freq: Once | INTRAMUSCULAR | Status: AC
  Administered 2016-02-02: 20 mL via INTRADERMAL
  Filled 2016-02-02: qty 20

## 2016-02-02 NOTE — ED Provider Notes (Signed)
[facility] Provider Note   CSN: [id number] Arrival date & time: 02/02/16  1641  First Provider Contact:  First MD Initiated Contact with Patient 02/02/16 1714    By signing my name below, I, Levon Hedger, attest that this documentation has been prepared under the direction and in the presence of Lavera Guise, MD . Electronically Signed: Levon Hedger, Scribe. 02/02/2016. 5:48 PM.  History   Chief Complaint Chief Complaint  Patient presents with  . Laceration    HPI [name]. is a 65 y.o. male who presents to the Emergency Department complaining of sudden onset, moderate pain to right chin s/p laceration. He states he was installing a ceiling fan when it dropped and hit his chin. Pt states he bleeds easily. Per pt, he does not have to be given factor replacement when he bleeds. Tetanus UTD. Pt denies any syncope or vomiting.  The history is provided by the patient. No language interpreter was used.    Past Medical History:  Diagnosis Date  . Arthritis   . Closed intertrochanteric fracture of left hip (HCC) 07/20/2011  . Gout   . Left patella fracture 07/19/2011  . 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 SVT (supraventricular tachycardia) (HCC) 01/08/2016  . Hammer toe of right foot 04/20/2014  . Other hammer toe (acquired) 03/19/2014  . Keratoma 03/19/2014  . Pain in lower limb 03/19/2014  . Neuropathy (HCC) 03/19/2014  . Closed intertrochanteric fracture of left hip (HCC) 07/20/2011  . 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 Left patella fracture 07/19/2011    Past Surgical History:  Procedure Laterality Date  . COMPRESSION HIP SCREW  07/20/2011   Procedure: COMPRESSION HIP;  Surgeon: Eulas Post, MD;  Location: [facility] OR;  Service: Orthopedics;  Laterality: Left;  . ELECTROPHYSIOLOGIC STUDY N/A 01/08/2016   Procedure: /SVT Ablation;  Surgeon: Marinus Maw, MD;  Location: [facility] INVASIVE CV LAB;  Service:  Cardiovascular;  Laterality: N/A;  . KNEE SURGERY    . ROUX-EN-Y PROCEDURE  2008   carpal tunnell  l rcr    Home Medications    Prior to Admission medications   Medication Sig Start Date End Date Taking? Authorizing Provider  gabapentin (NEURONTIN) 300 MG capsule Take 600 mg by mouth 2 (two) times daily.    Historical Provider, MD  traMADol (ULTRAM) 50 MG tablet Take 1 tablet (50 mg total) by mouth every 6 (six) hours as needed. For pain Patient not taking: Reported on 01/07/2016 07/05/13   [name], DPM    Family History Family History  Problem Relation Age of Onset  . Heart disease Father     Died in his 5s of a heart attack    Social History Social History  Substance Use Topics  . Smoking status: Never Smoker  . Smokeless tobacco: Never Used  . Alcohol use Yes     Allergies   Review of patient's allergies indicates no known allergies.   Review of Systems Review of Systems  Gastrointestinal: Negative for vomiting.  Skin: Positive for wound.  Neurological: Negative for syncope.  Hematological: Bruises/bleeds easily.  All other systems reviewed and are negative.   Physical Exam Updated Vital Signs BP 135/87 (BP Location: Right Arm)   Pulse 72   Temp 98.8 F (37.1 C) (Oral)   Ht 6' (1.829 m)   Wt 199 lb (90.3 kg)   SpO2 97%   BMI 26.99 kg/m  Physical Exam Physical Exam  Nursing note and vitals reviewed. Constitutional: Well developed, well nourished, non-toxic, and in no acute distress Head: Normocephalic and atraumatic.  Mouth/Throat: Oropharynx is clear and moist.  Neck: Normal range of motion. Neck supple.  Cardiovascular: Normal rate and regular rhythm.   Pulmonary/Chest: Effort normal and breath sounds normal.  Abdominal: Soft. There is no tenderness. There is no rebound and no guarding.  Musculoskeletal: Normal range of motion.  Neurological: Alert, no facial droop, fluent speech, moves all extremities symmetrically Skin: Skin is warm  and dry. 1.5 cm laceration to the right chin. Bleeding controlled.  Psychiatric: Cooperative  ED Treatments / Results  DIAGNOSTIC STUDIES:  Oxygen Saturation is 97% on RA, normal by my interpretation.    COORDINATION OF CARE:  5:24 PM Will suture laceration. Discussed treatment plan with pt at bedside and pt agreed to plan.  Labs (all labs ordered are listed, but only abnormal results are displayed) Labs Reviewed - No data to display  EKG  EKG Interpretation None       Radiology No results found.  Procedures .Marland KitchenLaceration Repair Date/Time: 02/02/2016 6:17 PM Performed by: Crista Curb DUO Authorized by: Crista Curb DUO   Consent:    Consent obtained:  Verbal   Consent given by:  Patient   Risks discussed:  Infection, pain and need for additional repair   Alternatives discussed:  No treatment Anesthesia (see MAR for exact dosages):    Anesthesia method:  None Laceration details:    Location:  Face   Face location:  Chin   Length (cm):  1.5 Repair type:    Repair type:  Simple Pre-procedure details:    Preparation:  Patient was prepped and draped in usual sterile fashion Exploration:    Hemostasis achieved with:  LET and direct pressure   Contaminated: no   Treatment:    Area cleansed with:  Betadine   Amount of cleaning:  Standard   Irrigation solution:  Tap water   Irrigation method:  Syringe   Visualized foreign bodies/material removed: no   Skin repair:    Repair method:  Sutures   Suture size:  4-0   Wound skin closure material used: vicryl.   Suture technique:  Simple interrupted   Number of sutures:  3 Approximation:    Approximation:  Close   Vermilion border: well-aligned   Post-procedure details:    Dressing:  Antibiotic ointment   Patient tolerance of procedure:  Tolerated well, no immediate complications   (including critical care time)  Medications Ordered in ED Medications  lidocaine-EPINEPHrine (XYLOCAINE W/EPI) 2 %-1:200000 (PF) injection  20 mL (20 mLs Intradermal Given 02/02/16 1729)  lidocaine-EPINEPHrine-tetracaine (LET) solution (3 mLs Topical Given 02/02/16 1729)     Initial Impression / Assessment and Plan / ED Course  I have reviewed the triage vital signs and the nursing notes.  Pertinent labs & imaging results that were available during my care of the patient were reviewed by me and considered in my medical decision making (see chart for details).  Clinical Course   [facility] records reviewed. Pt seen by hematologist in 2013. He had normal factor studies and normal coagulation. He has a family history of hemophilia but his hematology evaluation states he does not have hemophilia.   No evidence of head injury. Laceration repaired. Tetanus up to date. Strict return and follow-up instructions reviewed. He expressed understanding of all discharge instructions and felt comfortable with the plan of care.   Final Clinical  Impressions(s) / ED Diagnoses   Final diagnoses:  Chin laceration, initial encounter    I personally performed the services described in this documentation, which was scribed in my presence. The recorded information has been reviewed and is accurate.   New Prescriptions New Prescriptions   No medications on file     Lavera Guise, MD 02/02/16 [id number]

## 2016-02-02 NOTE — ED Notes (Signed)
Dr Verdie Mosher in room with pt now.

## 2016-02-02 NOTE — Discharge Instructions (Signed)
Your sutures will absorb away in 1 week or so.   Return for worsening symptoms, including fever, redness/swelling around wound, pus drainage or any other symptoms concerning to you.

## 2016-02-02 NOTE — ED Triage Notes (Signed)
Pt in c/o laceration to R chin from ceiling fan. Pt has hemophilia. Pressured applied by pt and bleeding controlled at this time. Alert, interactive, in NAD.

## 2016-02-02 NOTE — ED Notes (Signed)
Dr Verdie Mosher in room with patient now.

## 2016-02-04 DIAGNOSIS — Z79899 Other long term (current) drug therapy: Secondary | ICD-10-CM | POA: Diagnosis not present

## 2016-02-04 DIAGNOSIS — M25441 Effusion, right hand: Secondary | ICD-10-CM | POA: Diagnosis not present

## 2016-02-04 DIAGNOSIS — G629 Polyneuropathy, unspecified: Secondary | ICD-10-CM | POA: Diagnosis not present

## 2016-02-04 DIAGNOSIS — M25442 Effusion, left hand: Secondary | ICD-10-CM | POA: Diagnosis not present

## 2016-02-04 DIAGNOSIS — M25562 Pain in left knee: Secondary | ICD-10-CM | POA: Diagnosis not present

## 2016-02-11 ENCOUNTER — Encounter (INDEPENDENT_AMBULATORY_CARE_PROVIDER_SITE_OTHER): Payer: Self-pay

## 2016-02-11 ENCOUNTER — Encounter: Payer: Self-pay | Admitting: Internal Medicine

## 2016-02-11 ENCOUNTER — Ambulatory Visit (INDEPENDENT_AMBULATORY_CARE_PROVIDER_SITE_OTHER): Payer: Medicare Other | Admitting: Internal Medicine

## 2016-02-11 VITALS — BP 110/74 | HR 60 | Ht 72.0 in | Wt 213.0 lb

## 2016-02-11 DIAGNOSIS — I471 Supraventricular tachycardia: Secondary | ICD-10-CM | POA: Diagnosis not present

## 2016-02-11 NOTE — Patient Instructions (Addendum)
Medication Instructions:  Your physician recommends that you continue on your current medications as directed. Please refer to the Current Medication list given to you today.  Labwork: None ordered.  Testing/Procedures: None ordered.  Follow-Up: Your physician recommends that you schedule a follow-up appointment as needed.   Any Other Special Instructions Will Be Listed Below (If Applicable).     If you need a refill on your cardiac medications before your next appointment, please call your pharmacy.   

## 2016-02-11 NOTE — Progress Notes (Signed)
      HPI [name] returns today for followup. He is a pleasant 65 yo man with SVT who underwent EP Study and catheter ablation of AVNRT about 5 weeks ago. In the interim, he has done well. No SVT and no palpitations. He denies chest pain or sob.  No Known Allergies   Current Outpatient Prescriptions  Medication Sig Dispense Refill  . gabapentin (NEURONTIN) 300 MG capsule Take 600 mg by mouth 2 (two) times daily.    . traMADol (ULTRAM) 50 MG tablet Take 1 tablet (50 mg total) by mouth every 6 (six) hours as needed. For pain 30 tablet 1  . predniSONE (DELTASONE) 10 MG tablet Take 10 mg by mouth daily.     No current facility-administered medications for this visit.      Past Medical History:  Diagnosis Date  . Arthritis   . Closed intertrochanteric fracture of left hip (HCC) 07/20/2011  . Gout   . Left patella fracture 07/19/2011  . 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   Past Surgical History:  Procedure Laterality Date  . COMPRESSION HIP SCREW  07/20/2011   Procedure: COMPRESSION HIP;  Surgeon: Eulas Post, MD;  [facility];  Service: Orthopedics;  Laterality: Left;  . ELECTROPHYSIOLOGIC STUDY N/A 01/08/2016   Procedure: /SVT Ablation;  Surgeon: Marinus Maw, MD;  Location: [facility] INVASIVE CV LAB;  Service: Cardiovascular;  Laterality: N/A;  . KNEE SURGERY    . ROUX-EN-Y PROCEDURE  2008   carpal tunnell  l rcr     Family History  Problem Relation Age of Onset  . Heart disease Father     Died in his 41s of a heart attack     Social History   Social History  . Marital status: Married    Spouse name: N/A  . Number of children: N/A  . Years of education: N/A   Occupational History  . 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 Smoking status: Never Smoker  . Smokeless tobacco: Never Used  . Alcohol use Yes  . Drug use: No  . Sexual activity: Not on file    Other Topics Concern  . Not on file   Social History Narrative  . No narrative on file     Pulse 60   Ht 6' (1.829 m)   Wt 213 lb (96.6 kg)   BMI 28.89 kg/m  BP - 110/74 Physical Exam:  Well appearing NAD HEENT: Unremarkable Neck:  No JVD, no thyromegally Lymphatics:  No adenopathy Back:  No CVA tenderness Lungs:  Clear with no wheezes HEART:  Regular rate rhythm, no murmurs, no rubs, no clicks Abd:  soft, positive bowel sounds, no organomegally, no rebound, no guarding Ext:  2 plus pulses, no edema, no cyanosis, no clubbing Skin:  No rashes no nodules Neuro:  CN II through XII intact, motor grossly intact  EKG - nsr with LVH  Assess/Plan: 1. 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 He will undergo watchful waiting.  2. HTN - his blood pressure is well controlled.   Leonia Reeves.D.

## 2016-02-20 DIAGNOSIS — M79641 Pain in right hand: Secondary | ICD-10-CM | POA: Diagnosis not present

## 2016-02-20 DIAGNOSIS — M79642 Pain in left hand: Secondary | ICD-10-CM | POA: Diagnosis not present

## 2016-02-20 DIAGNOSIS — Z79899 Other long term (current) drug therapy: Secondary | ICD-10-CM | POA: Diagnosis not present

## 2016-02-20 DIAGNOSIS — G5601 Carpal tunnel syndrome, right upper limb: Secondary | ICD-10-CM | POA: Diagnosis not present

## 2016-02-20 DIAGNOSIS — M19041 Primary osteoarthritis, right hand: Secondary | ICD-10-CM | POA: Diagnosis not present

## 2016-02-20 DIAGNOSIS — M19042 Primary osteoarthritis, left hand: Secondary | ICD-10-CM | POA: Diagnosis not present

## 2016-03-18 DIAGNOSIS — M19042 Primary osteoarthritis, left hand: Secondary | ICD-10-CM | POA: Diagnosis not present

## 2016-03-18 DIAGNOSIS — M19041 Primary osteoarthritis, right hand: Secondary | ICD-10-CM | POA: Diagnosis not present

## 2016-03-18 DIAGNOSIS — M7532 Calcific tendinitis of left shoulder: Secondary | ICD-10-CM | POA: Diagnosis not present

## 2016-03-18 DIAGNOSIS — M79642 Pain in left hand: Secondary | ICD-10-CM | POA: Diagnosis not present

## 2016-03-18 DIAGNOSIS — M79641 Pain in right hand: Secondary | ICD-10-CM | POA: Diagnosis not present

## 2016-04-13 DIAGNOSIS — Z23 Encounter for immunization: Secondary | ICD-10-CM | POA: Diagnosis not present

## 2016-06-18 DIAGNOSIS — G629 Polyneuropathy, unspecified: Secondary | ICD-10-CM | POA: Diagnosis not present

## 2016-06-18 DIAGNOSIS — M79671 Pain in right foot: Secondary | ICD-10-CM | POA: Diagnosis not present

## 2016-06-18 DIAGNOSIS — M65342 Trigger finger, left ring finger: Secondary | ICD-10-CM | POA: Diagnosis not present

## 2016-06-18 DIAGNOSIS — M19041 Primary osteoarthritis, right hand: Secondary | ICD-10-CM | POA: Diagnosis not present

## 2016-06-18 DIAGNOSIS — M19042 Primary osteoarthritis, left hand: Secondary | ICD-10-CM | POA: Diagnosis not present

## 2016-07-01 DIAGNOSIS — M65331 Trigger finger, right middle finger: Secondary | ICD-10-CM | POA: Diagnosis not present

## 2016-07-01 DIAGNOSIS — G629 Polyneuropathy, unspecified: Secondary | ICD-10-CM | POA: Diagnosis not present

## 2016-07-01 DIAGNOSIS — M79671 Pain in right foot: Secondary | ICD-10-CM | POA: Diagnosis not present

## 2016-10-01 ENCOUNTER — Ambulatory Visit
Admission: RE | Admit: 2016-10-01 | Discharge: 2016-10-01 | Disposition: A | Discharge status: Home / Self Care | Payer: Medicare Other | Source: Ambulatory Visit | Attending: Rheumatology | Admitting: Rheumatology

## 2016-10-01 ENCOUNTER — Other Ambulatory Visit: Payer: Self-pay | Admitting: Rheumatology

## 2016-10-01 DIAGNOSIS — M542 Cervicalgia: Secondary | ICD-10-CM

## 2016-10-01 DIAGNOSIS — M19041 Primary osteoarthritis, right hand: Secondary | ICD-10-CM | POA: Diagnosis not present

## 2016-10-01 DIAGNOSIS — R202 Paresthesia of skin: Secondary | ICD-10-CM | POA: Diagnosis not present

## 2016-10-01 DIAGNOSIS — M47812 Spondylosis without myelopathy or radiculopathy, cervical region: Secondary | ICD-10-CM | POA: Diagnosis not present

## 2016-10-01 DIAGNOSIS — G629 Polyneuropathy, unspecified: Secondary | ICD-10-CM | POA: Diagnosis not present

## 2016-10-01 DIAGNOSIS — M19042 Primary osteoarthritis, left hand: Secondary | ICD-10-CM | POA: Diagnosis not present

## 2016-10-05 DIAGNOSIS — T148XXA Other injury of unspecified body region, initial encounter: Secondary | ICD-10-CM | POA: Diagnosis not present

## 2016-10-05 DIAGNOSIS — I493 Ventricular premature depolarization: Secondary | ICD-10-CM | POA: Diagnosis not present

## 2016-10-05 DIAGNOSIS — M533 Sacrococcygeal disorders, not elsewhere classified: Secondary | ICD-10-CM | POA: Diagnosis not present

## 2016-10-05 DIAGNOSIS — M25461 Effusion, right knee: Secondary | ICD-10-CM | POA: Diagnosis not present

## 2016-10-05 DIAGNOSIS — D62 Acute posthemorrhagic anemia: Secondary | ICD-10-CM | POA: Diagnosis not present

## 2016-10-05 DIAGNOSIS — Z96651 Presence of right artificial knee joint: Secondary | ICD-10-CM | POA: Diagnosis not present

## 2016-10-05 DIAGNOSIS — K219 Gastro-esophageal reflux disease without esophagitis: Secondary | ICD-10-CM | POA: Diagnosis not present

## 2016-10-05 DIAGNOSIS — Z9884 Bariatric surgery status: Secondary | ICD-10-CM | POA: Diagnosis not present

## 2016-10-05 DIAGNOSIS — Z86718 Personal history of other venous thrombosis and embolism: Secondary | ICD-10-CM | POA: Diagnosis not present

## 2016-10-05 DIAGNOSIS — M25551 Pain in right hip: Secondary | ICD-10-CM | POA: Diagnosis not present

## 2016-10-05 DIAGNOSIS — M8580 Other specified disorders of bone density and structure, unspecified site: Secondary | ICD-10-CM | POA: Diagnosis not present

## 2016-10-05 DIAGNOSIS — M1611 Unilateral primary osteoarthritis, right hip: Secondary | ICD-10-CM | POA: Diagnosis not present

## 2016-10-05 DIAGNOSIS — M4306 Spondylolysis, lumbar region: Secondary | ICD-10-CM | POA: Diagnosis not present

## 2016-10-05 DIAGNOSIS — M810 Age-related osteoporosis without current pathological fracture: Secondary | ICD-10-CM | POA: Diagnosis present

## 2016-10-05 DIAGNOSIS — M160 Bilateral primary osteoarthritis of hip: Secondary | ICD-10-CM | POA: Diagnosis not present

## 2016-10-05 DIAGNOSIS — M8080XA Other osteoporosis with current pathological fracture, unspecified site, initial encounter for fracture: Secondary | ICD-10-CM | POA: Diagnosis not present

## 2016-10-05 DIAGNOSIS — I10 Essential (primary) hypertension: Secondary | ICD-10-CM | POA: Diagnosis not present

## 2016-10-05 DIAGNOSIS — M069 Rheumatoid arthritis, unspecified: Secondary | ICD-10-CM | POA: Diagnosis present

## 2016-10-05 DIAGNOSIS — G8929 Other chronic pain: Secondary | ICD-10-CM | POA: Diagnosis not present

## 2016-10-05 DIAGNOSIS — M80051A Age-related osteoporosis with current pathological fracture, right femur, initial encounter for fracture: Secondary | ICD-10-CM | POA: Diagnosis not present

## 2016-10-05 DIAGNOSIS — S72001A Fracture of unspecified part of neck of right femur, initial encounter for closed fracture: Secondary | ICD-10-CM | POA: Diagnosis not present

## 2016-10-05 DIAGNOSIS — S72141A Displaced intertrochanteric fracture of right femur, initial encounter for closed fracture: Secondary | ICD-10-CM | POA: Diagnosis not present

## 2016-10-05 DIAGNOSIS — R0989 Other specified symptoms and signs involving the circulatory and respiratory systems: Secondary | ICD-10-CM | POA: Diagnosis not present

## 2016-10-05 DIAGNOSIS — W19XXXA Unspecified fall, initial encounter: Secondary | ICD-10-CM | POA: Diagnosis not present

## 2016-10-05 DIAGNOSIS — G894 Chronic pain syndrome: Secondary | ICD-10-CM | POA: Diagnosis present

## 2016-10-05 DIAGNOSIS — R001 Bradycardia, unspecified: Secondary | ICD-10-CM | POA: Diagnosis not present

## 2016-10-10 DIAGNOSIS — Z96653 Presence of artificial knee joint, bilateral: Secondary | ICD-10-CM | POA: Diagnosis not present

## 2016-10-10 DIAGNOSIS — M80851D Other osteoporosis with current pathological fracture, right femur, subsequent encounter for fracture with routine healing: Secondary | ICD-10-CM | POA: Diagnosis not present

## 2016-10-10 DIAGNOSIS — M159 Polyosteoarthritis, unspecified: Secondary | ICD-10-CM | POA: Diagnosis not present

## 2016-10-10 DIAGNOSIS — D66 Hereditary factor VIII deficiency: Secondary | ICD-10-CM | POA: Diagnosis not present

## 2016-10-10 DIAGNOSIS — G5623 Lesion of ulnar nerve, bilateral upper limbs: Secondary | ICD-10-CM | POA: Diagnosis not present

## 2016-10-10 DIAGNOSIS — W01198D Fall on same level from slipping, tripping and stumbling with subsequent striking against other object, subsequent encounter: Secondary | ICD-10-CM | POA: Diagnosis not present

## 2016-10-13 DIAGNOSIS — Z96653 Presence of artificial knee joint, bilateral: Secondary | ICD-10-CM | POA: Diagnosis not present

## 2016-10-13 DIAGNOSIS — M80851D Other osteoporosis with current pathological fracture, right femur, subsequent encounter for fracture with routine healing: Secondary | ICD-10-CM | POA: Diagnosis not present

## 2016-10-13 DIAGNOSIS — M159 Polyosteoarthritis, unspecified: Secondary | ICD-10-CM | POA: Diagnosis not present

## 2016-10-13 DIAGNOSIS — D66 Hereditary factor VIII deficiency: Secondary | ICD-10-CM | POA: Diagnosis not present

## 2016-10-13 DIAGNOSIS — G5623 Lesion of ulnar nerve, bilateral upper limbs: Secondary | ICD-10-CM | POA: Diagnosis not present

## 2016-10-13 DIAGNOSIS — W01198D Fall on same level from slipping, tripping and stumbling with subsequent striking against other object, subsequent encounter: Secondary | ICD-10-CM | POA: Diagnosis not present

## 2016-10-15 DIAGNOSIS — M80851D Other osteoporosis with current pathological fracture, right femur, subsequent encounter for fracture with routine healing: Secondary | ICD-10-CM | POA: Diagnosis not present

## 2016-10-15 DIAGNOSIS — Z96653 Presence of artificial knee joint, bilateral: Secondary | ICD-10-CM | POA: Diagnosis not present

## 2016-10-15 DIAGNOSIS — W01198D Fall on same level from slipping, tripping and stumbling with subsequent striking against other object, subsequent encounter: Secondary | ICD-10-CM | POA: Diagnosis not present

## 2016-10-15 DIAGNOSIS — M159 Polyosteoarthritis, unspecified: Secondary | ICD-10-CM | POA: Diagnosis not present

## 2016-10-15 DIAGNOSIS — G5623 Lesion of ulnar nerve, bilateral upper limbs: Secondary | ICD-10-CM | POA: Diagnosis not present

## 2016-10-15 DIAGNOSIS — D66 Hereditary factor VIII deficiency: Secondary | ICD-10-CM | POA: Diagnosis not present

## 2016-10-19 DIAGNOSIS — G629 Polyneuropathy, unspecified: Secondary | ICD-10-CM | POA: Diagnosis not present

## 2016-10-19 DIAGNOSIS — M542 Cervicalgia: Secondary | ICD-10-CM | POA: Diagnosis not present

## 2016-10-19 DIAGNOSIS — M25551 Pain in right hip: Secondary | ICD-10-CM | POA: Diagnosis not present

## 2016-10-19 DIAGNOSIS — R202 Paresthesia of skin: Secondary | ICD-10-CM | POA: Diagnosis not present

## 2016-10-19 DIAGNOSIS — Z79899 Other long term (current) drug therapy: Secondary | ICD-10-CM | POA: Diagnosis not present

## 2016-10-20 DIAGNOSIS — Z96653 Presence of artificial knee joint, bilateral: Secondary | ICD-10-CM | POA: Diagnosis not present

## 2016-10-20 DIAGNOSIS — G5623 Lesion of ulnar nerve, bilateral upper limbs: Secondary | ICD-10-CM | POA: Diagnosis not present

## 2016-10-20 DIAGNOSIS — D66 Hereditary factor VIII deficiency: Secondary | ICD-10-CM | POA: Diagnosis not present

## 2016-10-20 DIAGNOSIS — M80851D Other osteoporosis with current pathological fracture, right femur, subsequent encounter for fracture with routine healing: Secondary | ICD-10-CM | POA: Diagnosis not present

## 2016-10-20 DIAGNOSIS — W01198D Fall on same level from slipping, tripping and stumbling with subsequent striking against other object, subsequent encounter: Secondary | ICD-10-CM | POA: Diagnosis not present

## 2016-10-20 DIAGNOSIS — M159 Polyosteoarthritis, unspecified: Secondary | ICD-10-CM | POA: Diagnosis not present

## 2016-10-22 DIAGNOSIS — M159 Polyosteoarthritis, unspecified: Secondary | ICD-10-CM | POA: Diagnosis not present

## 2016-10-22 DIAGNOSIS — M80851D Other osteoporosis with current pathological fracture, right femur, subsequent encounter for fracture with routine healing: Secondary | ICD-10-CM | POA: Diagnosis not present

## 2016-10-22 DIAGNOSIS — G5623 Lesion of ulnar nerve, bilateral upper limbs: Secondary | ICD-10-CM | POA: Diagnosis not present

## 2016-10-22 DIAGNOSIS — D66 Hereditary factor VIII deficiency: Secondary | ICD-10-CM | POA: Diagnosis not present

## 2016-10-22 DIAGNOSIS — Z96653 Presence of artificial knee joint, bilateral: Secondary | ICD-10-CM | POA: Diagnosis not present

## 2016-10-22 DIAGNOSIS — W01198D Fall on same level from slipping, tripping and stumbling with subsequent striking against other object, subsequent encounter: Secondary | ICD-10-CM | POA: Diagnosis not present

## 2016-10-26 DIAGNOSIS — Z8781 Personal history of (healed) traumatic fracture: Secondary | ICD-10-CM | POA: Diagnosis not present

## 2016-10-26 DIAGNOSIS — M8589 Other specified disorders of bone density and structure, multiple sites: Secondary | ICD-10-CM | POA: Diagnosis not present

## 2016-10-26 DIAGNOSIS — M069 Rheumatoid arthritis, unspecified: Secondary | ICD-10-CM | POA: Diagnosis not present

## 2016-10-26 DIAGNOSIS — Z9884 Bariatric surgery status: Secondary | ICD-10-CM | POA: Diagnosis not present

## 2016-10-26 DIAGNOSIS — M8080XA Other osteoporosis with current pathological fracture, unspecified site, initial encounter for fracture: Secondary | ICD-10-CM | POA: Diagnosis not present

## 2016-10-26 DIAGNOSIS — E559 Vitamin D deficiency, unspecified: Secondary | ICD-10-CM | POA: Diagnosis not present

## 2016-10-26 DIAGNOSIS — M859 Disorder of bone density and structure, unspecified: Secondary | ICD-10-CM | POA: Diagnosis not present

## 2016-10-27 DIAGNOSIS — Z96653 Presence of artificial knee joint, bilateral: Secondary | ICD-10-CM | POA: Diagnosis not present

## 2016-10-27 DIAGNOSIS — Z9889 Other specified postprocedural states: Secondary | ICD-10-CM | POA: Diagnosis not present

## 2016-10-27 DIAGNOSIS — M159 Polyosteoarthritis, unspecified: Secondary | ICD-10-CM | POA: Diagnosis not present

## 2016-10-27 DIAGNOSIS — W01198D Fall on same level from slipping, tripping and stumbling with subsequent striking against other object, subsequent encounter: Secondary | ICD-10-CM | POA: Diagnosis not present

## 2016-10-27 DIAGNOSIS — M25551 Pain in right hip: Secondary | ICD-10-CM | POA: Diagnosis not present

## 2016-10-27 DIAGNOSIS — Z7982 Long term (current) use of aspirin: Secondary | ICD-10-CM | POA: Diagnosis not present

## 2016-10-27 DIAGNOSIS — D66 Hereditary factor VIII deficiency: Secondary | ICD-10-CM | POA: Diagnosis not present

## 2016-10-27 DIAGNOSIS — S72001A Fracture of unspecified part of neck of right femur, initial encounter for closed fracture: Secondary | ICD-10-CM | POA: Diagnosis not present

## 2016-10-27 DIAGNOSIS — M80851D Other osteoporosis with current pathological fracture, right femur, subsequent encounter for fracture with routine healing: Secondary | ICD-10-CM | POA: Diagnosis not present

## 2016-10-27 DIAGNOSIS — G5623 Lesion of ulnar nerve, bilateral upper limbs: Secondary | ICD-10-CM | POA: Diagnosis not present

## 2016-10-28 DIAGNOSIS — W01198D Fall on same level from slipping, tripping and stumbling with subsequent striking against other object, subsequent encounter: Secondary | ICD-10-CM | POA: Diagnosis not present

## 2016-10-28 DIAGNOSIS — Z96653 Presence of artificial knee joint, bilateral: Secondary | ICD-10-CM | POA: Diagnosis not present

## 2016-10-28 DIAGNOSIS — M159 Polyosteoarthritis, unspecified: Secondary | ICD-10-CM | POA: Diagnosis not present

## 2016-10-28 DIAGNOSIS — D66 Hereditary factor VIII deficiency: Secondary | ICD-10-CM | POA: Diagnosis not present

## 2016-10-28 DIAGNOSIS — M80851D Other osteoporosis with current pathological fracture, right femur, subsequent encounter for fracture with routine healing: Secondary | ICD-10-CM | POA: Diagnosis not present

## 2016-10-28 DIAGNOSIS — G5623 Lesion of ulnar nerve, bilateral upper limbs: Secondary | ICD-10-CM | POA: Diagnosis not present

## 2016-11-04 ENCOUNTER — Ambulatory Visit: Payer: Medicare Other | Attending: Surgical | Admitting: Physical Therapy

## 2016-11-04 DIAGNOSIS — M25551 Pain in right hip: Secondary | ICD-10-CM | POA: Diagnosis not present

## 2016-11-04 DIAGNOSIS — R262 Difficulty in walking, not elsewhere classified: Secondary | ICD-10-CM | POA: Insufficient documentation

## 2016-11-04 DIAGNOSIS — R2689 Other abnormalities of gait and mobility: Secondary | ICD-10-CM | POA: Diagnosis not present

## 2016-11-04 NOTE — Therapy (Signed)
[facility] [address]  [address] [place], [place], [address] Phone: [phone]   Fax:  [phone]  Physical Therapy Evaluation  Patient Details  Name: [name]. MRN: [id number] Date of Birth: May 19, 1951 Referring Provider: Dr. Forest Becker  Encounter Date: 11/04/2016      PT End of Session - 11/04/16 1609    Visit Number 1   Number of Visits 12   Date for PT Re-Evaluation 12/16/16   Authorization Type Medicare   PT Start Time 1405   PT Stop Time 1443   PT Time Calculation (min) 38 min   Activity Tolerance Patient tolerated treatment well   Behavior During Therapy [facility] for tasks assessed/performed      Past Medical History:  Diagnosis Date  . Arthritis   . Closed intertrochanteric fracture of left hip (HCC) 07/20/2011  . Gout   . Left patella fracture 07/19/2011  . S/P gastric bypass 07/19/2011    Past Surgical History:  Procedure Laterality Date  . COMPRESSION HIP SCREW  07/20/2011   Procedure: COMPRESSION HIP;  Surgeon: Eulas Post, MD;  Location: [facility] OR;  Service: Orthopedics;  Laterality: Left;  . ELECTROPHYSIOLOGIC STUDY N/A 01/08/2016   Procedure: /SVT Ablation;  Surgeon: Marinus Maw, MD;  Location: [facility] INVASIVE CV LAB;  Service: Cardiovascular;  Laterality: N/A;  . KNEE SURGERY    . ROUX-EN-Y PROCEDURE  2008   carpal tunnell  l rcr    There were no vitals filed for this visit.       Subjective Assessment - 11/04/16 1409    Subjective Patient falling on 10/05/16, tripped on rug - resultant femur fracture with IM nailing along with plate and screws. Currently off of pain medicine. Difficulty sleeping on side - has tried mutliple different positioning with no luck. Walking with [facility] currently. Plans to be released by surgeon on 11/15/16. Feels like he may have landed on on R knee when fell - now has some knee pain and wearing brace for approx 1 week - feels better with this.    Patient Stated  Goals get back to fishing   Currently in Pain? No/denies   Pain Score 0-No pain  "tender"   Pain Location Hip   Pain Orientation Right   Pain Descriptors / Indicators Tender            [facility] PT Assessment - 11/04/16 1415      Assessment   Medical Diagnosis R hip fracture s/p IM nailing   Referring Provider Dr. Forest Becker   Onset Date/Surgical Date 10/05/16   Next MD Visit 11/15/16   Prior Therapy yes - HHPT, and for TKA     Precautions   Precautions None     Restrictions   Weight Bearing Restrictions Yes   RLE Weight Bearing Weight bearing as tolerated     Balance Screen   Has the patient fallen in the past 6 months Yes   How many times? 1   Has the patient had a decrease in activity level because of a fear of falling?  No   Is the patient reluctant to leave their home because of a fear of falling?  No     Home Environment   Living Environment Private residence   Living Arrangements Spouse/significant other   Type of Home House   Home Access Stairs to enter   Entrance Stairs-Number of Steps 7   Entrance Stairs-Rails Can reach both   Home  Layout One level;Able to live on main level with bedroom/bathroom   Home Equipment [place] - single point     Prior Function   Level of Independence Independent   Vocation Retired;Self employed   Leisure fish     Cognition   Overall Cognitive Status Within Functional Limits for tasks assessed     Observation/Other Assessments   Focus on Therapeutic Outcomes (FOTO)  Hip: 51 (49% limited, predicted 35% limited)     Sensation   Light Touch Appears Intact     Coordination   Gross Motor Movements are Fluid and Coordinated Yes     ROM / Strength   AROM / PROM / Strength AROM;Strength     AROM   Overall AROM  Within functional limits for tasks performed     Strength   Strength Assessment Site Hip;Knee   Right/Left Hip Right;Left   Right Hip Flexion 3+/5   Right Hip ABduction 3+/5   Left Hip Flexion 5/5   Right/Left  Knee Right;Left   Right Knee Flexion 4+/5   Right Knee Extension 4+/5   Left Knee Flexion 5/5   Left Knee Extension 5/5     Flexibility   Soft Tissue Assessment /Muscle Length yes   Hamstrings R tightness     Palpation   Palpation comment diffusely non-tender     Ambulation/Gait   Ambulation/Gait Yes   Ambulation/Gait Assistance 6: Modified independent (Device/Increase time)   Ambulation Distance (Feet) 150 Feet   Assistive device Straight cane   Gait Pattern Step-through pattern;Decreased step length - left;Decreased stance time - right;Decreased hip/knee flexion - right;Decreased weight shift to right;Antalgic   Ambulation Surface Level;Indoor   Gait velocity 2.10 feet/second                           PT Education - 11/04/16 1609    Education provided Yes   Education Details exam findings, POC, HEP   Person(s) Educated Patient   Methods Explanation;Demonstration;Handout   Comprehension Verbalized understanding;Returned demonstration             PT Long Term Goals - 11/04/16 1614      PT LONG TERM GOAL #1   Title patient to be independent with advanced HEP (12/16/16)   Status New     PT LONG TERM GOAL #2   Title Patient to demonstrate proper gait mechaincs with good heel toe gait pattern with no evidence of instability (12/16/16)   Status New     PT LONG TERM GOAL #3   Title Patient to improve R hip strength to >/= 4+/5 (12/16/16)   Status New     PT LONG TERM GOAL #4   Title Patient to report ability to safely enter/exit fishing boat without instability (12/16/16)   Status New               Plan - 11/04/16 1609    Clinical Impression Statement Patient is a 66 y/o male presenting to OPPT today for initial eval follow R hip fracture s/p IM nailing. Patient today ambualting WBAT on R LE with SPC with antalgic gait pattern and slight reduced weight acceptance onto R LE. Patient with good ROM at hip, however with slight tightness in  hamstrings and hip flexor mm groups. patient also with slight strength deficits as compared to L LE in hip flexor and hip abductor mm groups. patient to benefit from PT intervention to address gait, strength, and functional mobility deficits to allow  for improved functional mobility and overall improved quality of life.    Rehab Potential Excellent   PT Frequency 2x / week   PT Duration 6 weeks   PT Treatment/Interventions ADLs/Self Care Home Management;Cryotherapy;Electrical Stimulation;Moist Heat;Ultrasound;Neuromuscular re-education;Balance training;Therapeutic exercise;Therapeutic activities;Functional mobility training;Stair training;Gait training;Patient/family education;Manual techniques;Passive range of motion;Vasopneumatic Device;Taping;Dry needling   PT Next Visit Plan hip strengthening, stretching   Consulted and Agree with Plan of Care Patient      Patient will benefit from skilled therapeutic intervention in order to improve the following deficits and impairments:  Abnormal gait, Decreased activity tolerance, Decreased balance, Decreased mobility, Decreased strength, Difficulty walking, Pain  Visit Diagnosis: Pain in right hip - Plan: PT plan of care cert/re-cert  Difficulty in walking, not elsewhere classified - Plan: PT plan of care cert/re-cert  Other abnormalities of gait and mobility - Plan: PT plan of care cert/re-cert      G-Codes - Nov 24, 2016 1617    Functional Assessment Tool Used (Outpatient Only) FOTO: 51 (49% limited)   Functional Limitation Mobility: Walking and moving around   Mobility: Walking and Moving Around Current Status ([id number]) At least 40 percent but less than 60 percent impaired, limited or restricted   Mobility: Walking and Moving Around Goal Status [phone]) At least 20 percent but less than 40 percent impaired, limited or restricted       Problem List Patient Active Problem List   Diagnosis Date Noted  . SVT (supraventricular tachycardia) (HCC)  01/08/2016  . Hammer toe of right foot 04/20/2014  . Other hammer toe (acquired) 03/19/2014  . Keratoma 03/19/2014  . Pain in lower limb 03/19/2014  . Neuropathy 03/19/2014  . Closed intertrochanteric fracture of left hip (HCC) 07/20/2011  . 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 Left patella fracture 07/19/2011      Kipp Laurence, PT, DPT 11-24-16 4:19 PM   [facility] [address]  [address] [place], [place], [address] Phone: [phone]   Fax:  [phone]  Name: Vernon Maish. MRN: [id number] Date of Birth: 1950-12-21

## 2016-11-04 NOTE — Patient Instructions (Signed)
Strengthening: Straight Leg Raise (Phase 1)   Tighten muscles on front of right thigh, then lift leg __6-8__ inches from surface, keeping knee locked.  Repeat __15__ times per set. Do __2__ sets per session.    Bridging   Slowly raise buttocks from floor, keeping stomach tight. Repeat __15__ times per set. Do _2___ sets per session.    Mini Squat: Double Leg    With feet shoulder width apart, reach forward for balance and do a mini squat. Keep knees in line with second toe. Knees do not go past toes. Repeat _15__ times per set. Do _2__ sets per session.   Hip Abduction: Standing - Straight Leg    In shoulder width stance, tubing around ankles, pull leg out to side, keeping knee straight. Repeat _15_ times per set. Repeat with other leg. Do _2_ sets per session.    Hamstring Step 2   Left foot relaxed, knee straight, other leg bent, foot flat. Raise straight leg further upward to maximal range. Hold __30_ seconds. Relax leg completely down. Repeat _3__ times.

## 2016-11-10 ENCOUNTER — Ambulatory Visit: Payer: Medicare Other

## 2016-11-10 DIAGNOSIS — R262 Difficulty in walking, not elsewhere classified: Secondary | ICD-10-CM

## 2016-11-10 DIAGNOSIS — M25551 Pain in right hip: Secondary | ICD-10-CM

## 2016-11-10 DIAGNOSIS — R2689 Other abnormalities of gait and mobility: Secondary | ICD-10-CM | POA: Diagnosis not present

## 2016-11-10 NOTE — Therapy (Signed)
[facility] [address]  [address] [place], [place], [address] Phone: [phone]   Fax:  [phone]  Physical Therapy Treatment  Patient Details  Name: [name]. MRN: [id number] Date of Birth: 18-Aug-1950 Referring Provider: Dr. Forest Becker   Encounter Date: 11/10/2016      PT End of Session - 11/10/16 1630    Visit Number 2   Number of Visits 12   Date for PT Re-Evaluation 12/16/16   Authorization Type Medicare   PT Start Time 1621   PT Stop Time 1701   PT Time Calculation (min) 40 min   Activity Tolerance Patient tolerated treatment well   Behavior During Therapy [facility] for tasks assessed/performed      Past Medical History:  Diagnosis Date  . Arthritis   . Closed intertrochanteric fracture of left hip (HCC) 07/20/2011  . Gout   . Left patella fracture 07/19/2011  . S/P gastric bypass 07/19/2011    Past Surgical History:  Procedure Laterality Date  . COMPRESSION HIP SCREW  07/20/2011   Procedure: COMPRESSION HIP;  Surgeon: Eulas Post, MD;  Location: [facility] OR;  Service: Orthopedics;  Laterality: Left;  . ELECTROPHYSIOLOGIC STUDY N/A 01/08/2016   Procedure: /SVT Ablation;  Surgeon: Marinus Maw, MD;  Location: [facility] INVASIVE CV LAB;  Service: Cardiovascular;  Laterality: N/A;  . KNEE SURGERY    . ROUX-EN-Y PROCEDURE  2008   carpal tunnell  l rcr    There were no vitals filed for this visit.      Subjective Assessment - 11/10/16 1655    Subjective Pt. doing well with HEP activities without issues.     Patient Stated Goals get back to fishing   Currently in Pain? No/denies   Pain Score 0-No pain   Multiple Pain Sites No            [facility] PT Assessment - 11/10/16 1629      Assessment   Medical Diagnosis R hip fracture s/p IM nailing   Referring Provider Dr. Forest Becker    Onset Date/Surgical Date 10/05/16   Next MD Visit 11/17/16   Prior Therapy yes - HHPT, and for TKA                      [facility] Adult PT Treatment/Exercise - 11/10/16 1645      Knee/Hip Exercises: Stretches   Passive Hamstring Stretch Right;2 reps;30 seconds   Passive Hamstring Stretch Limitations with strap    Piriformis Stretch Right;1 rep;30 seconds   Piriformis Stretch Limitations Figure-4      Knee/Hip Exercises: Aerobic   Stationary Bike Bike: lvl 1, 6 min      Knee/Hip Exercises: Standing   Hip Abduction Right;Left;Stengthening;1 set;Knee straight;20 reps   Functional Squat 1 set;15 reps;3 seconds   Functional Squat Limitations at sink; cueing for proper technique     Knee/Hip Exercises: Supine   Bridges Both;Strengthening;1 set;10 reps   Bridges with Clamshell Both;Strengthening;1 set;20 reps  with red TB around knees    Straight Leg Raises Right;1 set;10 reps   Straight Leg Raises Limitations with strap assist with UE due to pain with full AROM      Knee/Hip Exercises: Sidelying   Clams L sidelying R hip clam shell x 20 reps  no resistance                 PT Education - 11/10/16 1800    Education provided Yes  Education Details KTOS for piriformis stretch, Figure-4 piriformis stretch    Person(s) Educated Patient   Methods Explanation;Demonstration;Verbal cues;Handout   Comprehension Verbalized understanding;Returned demonstration;Verbal cues required;Need further instruction             PT Long Term Goals - 11/10/16 1631      PT LONG TERM GOAL #1   Title patient to be independent with advanced HEP (12/16/16)   Status On-going     PT LONG TERM GOAL #2   Title Patient to demonstrate proper gait mechaincs with good heel toe gait pattern with no evidence of instability (12/16/16)   Status On-going     PT LONG TERM GOAL #3   Title Patient to improve R hip strength to >/= 4+/5 (12/16/16)   Status On-going     PT LONG TERM GOAL #4   Title Patient to report ability to safely enter/exit fishing boat without instability (12/16/16)   Status  On-going               Plan - 11/10/16 1631    Clinical Impression Statement Pt. doing well today.  Pt. reporting no issues with HEP however with demo was unable to perform SLR due to onset of R hip pain.  Pt. instructed to modify this activity with strap assist.  Pt. noting R hip pain most therex activities today, which quickly subsided following.  Pt. still very ttp on R lateral hip and thigh musculature.  Will progress strengthening and stretching as pt. tolerates in coming visits.   PT Treatment/Interventions ADLs/Self Care Home Management;Cryotherapy;Electrical Stimulation;Moist Heat;Ultrasound;Neuromuscular re-education;Balance training;Therapeutic exercise;Therapeutic activities;Functional mobility training;Stair training;Gait training;Patient/family education;Manual techniques;Passive range of motion;Vasopneumatic Device;Taping;Dry needling   PT Next Visit Plan Monitor adherence to HEP/tolerance for SLR; Hip strengthening, stretching      Patient will benefit from skilled therapeutic intervention in order to improve the following deficits and impairments:  Abnormal gait, Decreased activity tolerance, Decreased balance, Decreased mobility, Decreased strength, Difficulty walking, Pain  Visit Diagnosis: Pain in right hip  Difficulty in walking, not elsewhere classified  Other abnormalities of gait and mobility     Problem List Patient Active Problem List   Diagnosis Date Noted  . SVT (supraventricular tachycardia) (HCC) 01/08/2016  . Hammer toe of right foot 04/20/2014  . Other hammer toe (acquired) 03/19/2014  . Keratoma 03/19/2014  . Pain in lower limb 03/19/2014  . Neuropathy 03/19/2014  . Closed intertrochanteric fracture of left hip (HCC) 07/20/2011  . 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 Left patella fracture 07/19/2011    Kermit Balo, PTA 11/10/16 6:10 PM  [facility] [address]  [address] [place], [place], [address] Phone: [phone]   Fax:  [phone]  Name: [name]. MRN: [id number] Date of Birth: [birth year]

## 2016-11-12 ENCOUNTER — Ambulatory Visit: Payer: Medicare Other

## 2016-11-12 DIAGNOSIS — M25551 Pain in right hip: Secondary | ICD-10-CM

## 2016-11-12 DIAGNOSIS — R262 Difficulty in walking, not elsewhere classified: Secondary | ICD-10-CM

## 2016-11-12 DIAGNOSIS — R2689 Other abnormalities of gait and mobility: Secondary | ICD-10-CM

## 2016-11-12 NOTE — Therapy (Signed)
[facility] Outpatient Rehabilitation [facility] [address]  [address] [place], [place], [address] Phone: [phone]   Fax:  [phone]  Physical Therapy Treatment  Patient Details  Name: [name]. MRN: [id number] Date of Birth: 09-18-1950 Referring Provider: Dr. Forest Becker   Encounter Date: 11/12/2016      PT End of Session - 11/12/16 1615    Visit Number 3   Number of Visits 12   Date for PT Re-Evaluation 12/16/16   Authorization Type Medicare   PT Start Time 1608   PT Stop Time 1713   PT Time Calculation (min) 65 min   Activity Tolerance Patient tolerated treatment well   Behavior During Therapy [facility] for tasks assessed/performed      Past Medical History:  Diagnosis Date  . Arthritis   . Closed intertrochanteric fracture of left hip (HCC) 07/20/2011  . Gout   . Left patella fracture 07/19/2011  . S/P gastric bypass 07/19/2011    Past Surgical History:  Procedure Laterality Date  . COMPRESSION HIP SCREW  07/20/2011   Procedure: COMPRESSION HIP;  Surgeon: Eulas Post, MD;  [facility];  Service: Orthopedics;  Laterality: Left;  . ELECTROPHYSIOLOGIC STUDY N/A 01/08/2016   Procedure: /SVT Ablation;  Surgeon: Marinus Maw, MD;  Location: [facility] INVASIVE CV LAB;  Service: Cardiovascular;  Laterality: N/A;  . KNEE SURGERY    . ROUX-EN-Y PROCEDURE  2008   carpal tunnell  l rcr    There were no vitals filed for this visit.      Subjective Assessment - 11/12/16 1614    Subjective Pt. noting he is still having trouble with SLR HEP activity unable to perform with strap assist at home.     Patient Stated Goals get back to fishing   Currently in Pain? No/denies   Pain Score 0-No pain   Multiple Pain Sites No                         [facility] Adult PT Treatment/Exercise - 11/12/16 1635      Self-Care   Self-Care Other Self-Care Comments   Other Self-Care Comments  Seated self-massage with "roller stick" to R thigh  musculature; cueing required for slow pace and proper pressure; pt. instructed to use rolling pin or hand for manual massage to lateral thigh to decrease tone and pain daily     Knee/Hip Exercises: Stretches   Passive Hamstring Stretch Right;2 reps;30 seconds   Passive Hamstring Stretch Limitations with strap    Piriformis Stretch Right;30 seconds;2 reps   Piriformis Stretch Limitations Figure-4, KTOS      Knee/Hip Exercises: Aerobic   Nustep Lvl 4, 6 min      Knee/Hip Exercises: Standing   Hip Flexion Right;Left;10 reps;Knee straight;2 sets;Knee bent   Hip Flexion Limitations holding onto treadmill    Functional Squat 1 set;15 reps;3 seconds   Functional Squat Limitations at [employer]      Modalities   Modalities Corporate investment banker Stimulation Location R lateral quad/ITB   Electrical Stimulation Action IFC   Electrical Stimulation Parameters intensity to pt. tolerance   Electrical Stimulation Goals Pain     Manual Therapy   Manual Therapy Soft tissue mobilization;Myofascial release   Manual therapy comments prone and L sidelying with R LE on bolster    Soft tissue mobilization STM/strumming with "stick" to R lateral/distal HS, lateral quad, ITB  Myofascial Release TPR to R distal ITB/HS                     PT Long Term Goals - 11/10/16 1631      PT LONG TERM GOAL #1   Title patient to be independent with advanced HEP (12/16/16)   Status On-going     PT LONG TERM GOAL #2   Title Patient to demonstrate proper gait mechaincs with good heel toe gait pattern with no evidence of instability (12/16/16)   Status On-going     PT LONG TERM GOAL #3   Title Patient to improve R hip strength to >/= 4+/5 (12/16/16)   Status On-going     PT LONG TERM GOAL #4   Title Patient to report ability to safely enter/exit fishing boat without instability (12/16/16)   Status On-going               Plan - 11/12/16 1615     Clinical Impression Statement Pt. still requiring strap assist to perform SLR HEP activity at home due to pain and weakness.  Some hip flexor strengthening in gravity minimized positions today with good tolerance.  Pt. still tight in R hip flexor, ITB, and HS with difficulty relaxing with passive stretching today.  Significant STM, strumming, and TPR to R lateral quads, ITB, and HS with some relief noted following this.  Treatment ending with E-stim to lateral thigh musculature to decrease tone and pain.  Will monitor response next visit.     PT Treatment/Interventions ADLs/Self Care Home Management;Cryotherapy;Electrical Stimulation;Moist Heat;Ultrasound;Neuromuscular re-education;Balance training;Therapeutic exercise;Therapeutic activities;Functional mobility training;Stair training;Gait training;Patient/family education;Manual techniques;Passive range of motion;Vasopneumatic Device;Taping;Dry needling   PT Next Visit Plan Monitor adherence to HEP/tolerance for SLR; Hip strengthening, stretching      Patient will benefit from skilled therapeutic intervention in order to improve the following deficits and impairments:  Abnormal gait, Decreased activity tolerance, Decreased balance, Decreased mobility, Decreased strength, Difficulty walking, Pain  Visit Diagnosis: Pain in right hip  Difficulty in walking, not elsewhere classified  Other abnormalities of gait and mobility     Problem List Patient Active Problem List   Diagnosis Date Noted  . SVT (supraventricular tachycardia) (HCC) 01/08/2016  . Hammer toe of right foot 04/20/2014  . Other hammer toe (acquired) 03/19/2014  . Keratoma 03/19/2014  . Pain in lower limb 03/19/2014  . Neuropathy 03/19/2014  . Closed intertrochanteric fracture of left hip (HCC) 07/20/2011  . 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 Left patella fracture 07/19/2011    Kermit Balo, PTA 11/13/16 9:09 AM  [facility]patient Rehabilitation [facility] [address]  [address] [place], [place], [address] Phone: [phone]   Fax:  [phone]  Name: [name]. MRN: [id number] Date of Birth: 09-Jan-1951

## 2016-11-16 ENCOUNTER — Ambulatory Visit: Payer: Medicare Other | Admitting: Physical Therapy

## 2016-11-16 DIAGNOSIS — M25551 Pain in right hip: Secondary | ICD-10-CM

## 2016-11-16 DIAGNOSIS — R2689 Other abnormalities of gait and mobility: Secondary | ICD-10-CM | POA: Diagnosis not present

## 2016-11-16 DIAGNOSIS — R262 Difficulty in walking, not elsewhere classified: Secondary | ICD-10-CM

## 2016-11-16 NOTE — Therapy (Signed)
[facility] Outpatient Rehabilitation [facility] [address]  [address] [place], [place], [address] Phone: [phone]   Fax:  [phone]  Physical Therapy Treatment  Patient Details  Name: [name]. MRN: [id number] Date of Birth: 1951/04/20 Referring Provider: Dr. Forest Becker   Encounter Date: 11/16/2016      PT End of Session - 11/16/16 1637    Visit Number 4   Number of Visits 12   Date for PT Re-Evaluation 12/16/16   Authorization Type Medicare   PT Start Time 1535   PT Stop Time 1639   PT Time Calculation (min) 64 min   Activity Tolerance Patient tolerated treatment well   Behavior During Therapy [facility] for tasks assessed/performed      Past Medical History:  Diagnosis Date  . Arthritis   . Closed intertrochanteric fracture of left hip (HCC) 07/20/2011  . Gout   . Left patella fracture 07/19/2011  . S/P gastric bypass 07/19/2011    Past Surgical History:  Procedure Laterality Date  . COMPRESSION HIP SCREW  07/20/2011   Procedure: COMPRESSION HIP;  Surgeon: Eulas Post, MD;  [facility] OR;  Service: Orthopedics;  Laterality: Left;  . ELECTROPHYSIOLOGIC STUDY N/A 01/08/2016   Procedure: /SVT Ablation;  Surgeon: Marinus Maw, MD;  Location: [facility] INVASIVE CV LAB;  Service: Cardiovascular;  Laterality: N/A;  . KNEE SURGERY    . ROUX-EN-Y PROCEDURE  2008   carpal tunnell  l rcr    There were no vitals filed for this visit.      Subjective Assessment - 11/16/16 1637    Subjective Having some R buttock pain - goes to MD tomorrow   Patient Stated Goals get back to fishing   Currently in Pain? No/denies   Pain Score 0-No pain                         [facility] Adult PT Treatment/Exercise - 11/16/16 0001      Knee/Hip Exercises: Aerobic   Nustep L5 x 6 minutes     Knee/Hip Exercises: Machines for Strengthening   Cybex Knee Extension 25# B LE x 10; 20# R LE eccentric x 15 reps   Cybex Knee Flexion 20# BLE x 10; 15# R  LE eccentric x 15     Knee/Hip Exercises: Supine   Bridges Both;15 reps   Straight Leg Raises Right;15 reps   Straight Leg Raises Limitations some AA from PT to ensure full effort rather than reliance on strap     Knee/Hip Exercises: Sidelying   Hip ABduction Right;15 reps   Clams R LE x 15     Modalities   Modalities Electrical Stimulation     Electrical Stimulation   Electrical Stimulation Location R hip   Electrical Stimulation Action IFC   Electrical Stimulation Parameters to tolerance   Electrical Stimulation Goals Pain     Manual Therapy   Manual Therapy Soft tissue mobilization   Soft tissue mobilization education on self STM with ball against wall with good carryover                     PT Long Term Goals - 11/10/16 1631      PT LONG TERM GOAL #1   Title patient to be independent with advanced HEP (12/16/16)   Status On-going     PT LONG TERM GOAL #2   Title Patient to demonstrate proper gait mechaincs with good heel  toe gait pattern with no evidence of instability (12/16/16)   Status On-going     PT LONG TERM GOAL #3   Title Patient to improve R hip strength to >/= 4+/5 (12/16/16)   Status On-going     PT LONG TERM GOAL #4   Title Patient to report ability to safely enter/exit fishing boat without instability (12/16/16)   Status On-going               Plan - 11/16/16 1638    Clinical Impression Statement Patient doing well today - reports some glute med/min pain along with pain at/around greater trochanter. Discussion with patietn regarding some possible slight bursitis at greater trochanter and to discuss this with MD tomorrow. Patient doing better with SLR - however, does require some AA from PT, however, much more effort put into activity without use of strap. Patient encourage to keep moving throughout day and to rely less on hands to lift R LE up, but rather to engage hip flexor musculature more from improved carryover.    PT  Treatment/Interventions ADLs/Self Care Home Management;Cryotherapy;Electrical Stimulation;Moist Heat;Ultrasound;Neuromuscular re-education;Balance training;Therapeutic exercise;Therapeutic activities;Functional mobility training;Stair training;Gait training;Patient/family education;Manual techniques;Passive range of motion;Vasopneumatic Device;Taping;Dry needling   PT Next Visit Plan  Hip strengthening, stretching   Consulted and Agree with Plan of Care Patient      Patient will benefit from skilled therapeutic intervention in order to improve the following deficits and impairments:  Abnormal gait, Decreased activity tolerance, Decreased balance, Decreased mobility, Decreased strength, Difficulty walking, Pain  Visit Diagnosis: Pain in right hip  Difficulty in walking, not elsewhere classified  Other abnormalities of gait and mobility     Problem List Patient Active Problem List   Diagnosis Date Noted  . SVT (supraventricular tachycardia) (HCC) 01/08/2016  . Hammer toe of right foot 04/20/2014  . Other hammer toe (acquired) 03/19/2014  . Keratoma 03/19/2014  . Pain in lower limb 03/19/2014  . Neuropathy 03/19/2014  . Closed intertrochanteric fracture of left hip (HCC) 07/20/2011  . 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 Left patella fracture 07/19/2011     Kipp Laurence, PT, DPT 11/16/16 6:00 PM   [facility] [address]  [address] [place], [place], [address] Phone: [phone]   Fax:  [phone]  Name: [name]. MRN: [id number] Date of Birth: May 09, 1951

## 2016-11-17 DIAGNOSIS — M85851 Other specified disorders of bone density and structure, right thigh: Secondary | ICD-10-CM | POA: Diagnosis not present

## 2016-11-17 DIAGNOSIS — S72001A Fracture of unspecified part of neck of right femur, initial encounter for closed fracture: Secondary | ICD-10-CM | POA: Diagnosis not present

## 2016-11-17 DIAGNOSIS — Z96651 Presence of right artificial knee joint: Secondary | ICD-10-CM | POA: Diagnosis not present

## 2016-11-17 DIAGNOSIS — S72001D Fracture of unspecified part of neck of right femur, subsequent encounter for closed fracture with routine healing: Secondary | ICD-10-CM | POA: Diagnosis not present

## 2016-11-25 ENCOUNTER — Ambulatory Visit: Payer: Medicare Other

## 2016-11-25 DIAGNOSIS — R262 Difficulty in walking, not elsewhere classified: Secondary | ICD-10-CM

## 2016-11-25 DIAGNOSIS — R2689 Other abnormalities of gait and mobility: Secondary | ICD-10-CM

## 2016-11-25 DIAGNOSIS — M25551 Pain in right hip: Secondary | ICD-10-CM

## 2016-11-26 NOTE — Therapy (Signed)
[facility] Outpatient Rehabilitation [facility] [address]  [address] [place], [place], [address] Phone: [phone]   Fax:  [phone]  Physical Therapy Treatment  Patient Details  Name: [name]. MRN: [id number] Date of Birth: 1950-08-05 Referring Provider: Dr. Forest Becker   Encounter Date: 11/25/2016      PT End of Session - 11/25/16 1705    Visit Number 5   Number of Visits 12   Date for PT Re-Evaluation 12/16/16   Authorization Type Medicare   PT Start Time 1702   PT Stop Time 1750   PT Time Calculation (min) 48 min   Activity Tolerance Patient tolerated treatment well   Behavior During Therapy [facility] for tasks assessed/performed      Past Medical History:  Diagnosis Date  . Arthritis   . Closed intertrochanteric fracture of left hip (HCC) 07/20/2011  . Gout   . Left patella fracture 07/19/2011  . S/P gastric bypass 07/19/2011    Past Surgical History:  Procedure Laterality Date  . COMPRESSION HIP SCREW  07/20/2011   Procedure: COMPRESSION HIP;  Surgeon: Eulas Post, MD;  Location: [facility] OR;  Service: Orthopedics;  Laterality: Left;  . ELECTROPHYSIOLOGIC STUDY N/A 01/08/2016   Procedure: /SVT Ablation;  Surgeon: Marinus Maw, MD;  Location: [facility] INVASIVE CV LAB;  Service: Cardiovascular;  Laterality: N/A;  . KNEE SURGERY    . ROUX-EN-Y PROCEDURE  2008   carpal tunnell  l rcr    There were no vitals filed for this visit.      Subjective Assessment - 11/25/16 1705    Subjective Pt. noting ball massage on wall has helped.     Patient Stated Goals get back to fishing   Currently in Pain? No/denies   Pain Score 0-No pain   Multiple Pain Sites No                         [facility] Adult PT Treatment/Exercise - 11/25/16 1714      Ambulation/Gait   Ambulation/Gait Yes   Ambulation/Gait Assistance 6: Modified independent (Device/Increase time)   Ambulation Distance (Feet) 180 Feet   Assistive device None   Gait  Pattern Step-through pattern;Decreased step length - left;Decreased stance time - right;Decreased hip/knee flexion - right;Decreased weight shift to right;Antalgic   Ambulation Surface Level;Indoor   Gait Comments Working on B heel strike and even wt. shift with pt. able to self-correct however limited carryover in treatment     Knee/Hip Exercises: Machines for Strengthening   Cybex Knee Extension 25# B LE x 15 reps; 25# R LE eccentric x 15 reps   Cybex Knee Flexion 25# BLE x 15 reps; 20# R LE eccentric x 15 reps      Knee/Hip Exercises: Seated   Other Seated Knee/Hip Exercises Seated L fitter leg press (2 black bands) x 15 reps      Knee/Hip Exercises: Supine   Bridges Both;15 reps   Bridges with Clamshell Both;Strengthening;1 set;20 reps  with sustained hip abd/ER with green TB around knees   Straight Leg Raises Right;15 reps  able to perform x 4 reps without assist; painful    Straight Leg Raises Limitations with slight therapist assist      Knee/Hip Exercises: Sidelying   Hip ABduction Right;15 reps   Clams R LE x 15 with yellow TB around knees  PT Long Term Goals - 11/10/16 1631      PT LONG TERM GOAL #1   Title patient to be independent with advanced HEP (12/16/16)   Status On-going     PT LONG TERM GOAL #2   Title Patient to demonstrate proper gait mechaincs with good heel toe gait pattern with no evidence of instability (12/16/16)   Status On-going     PT LONG TERM GOAL #3   Title Patient to improve R hip strength to >/= 4+/5 (12/16/16)   Status On-going     PT LONG TERM GOAL #4   Title Patient to 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 noting he is still finding original HEP challenging and has not been performing squat due to pain with this.  Pt. encouraged to perform HEP in entirety consistently.   Pt. with some improved tolerance for TE today with improved ability to perform SLR with assist.  Seen in treatment with AD thus gait training to improve wt. shift and bilateral heel strike.  Pt. progressing well.   PT Treatment/Interventions ADLs/Self Care Home Management;Cryotherapy;Electrical Stimulation;Moist Heat;Ultrasound;Neuromuscular re-education;Balance training;Therapeutic exercise;Therapeutic activities;Functional mobility training;Stair training;Gait training;Patient/family education;Manual techniques;Passive range of motion;Vasopneumatic Device;Taping;Dry needling   PT Next Visit Plan  Hip strengthening, stretching      Patient will benefit from skilled therapeutic intervention in order to improve the following deficits and impairments:  Abnormal gait, Decreased activity tolerance, Decreased balance, Decreased mobility, Decreased strength, Difficulty walking, Pain  Visit Diagnosis: Pain in right hip  Difficulty in walking, not elsewhere classified  Other abnormalities of gait and mobility     Problem List Patient Active Problem List   Diagnosis Date Noted  . SVT (supraventricular tachycardia) (HCC) 01/08/2016  . Hammer toe of right foot 04/20/2014  . Other hammer toe (acquired) 03/19/2014  . Keratoma 03/19/2014  . Pain in lower limb 03/19/2014  . Neuropathy 03/19/2014  . Closed intertrochanteric fracture of left hip (HCC) 07/20/2011  . 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 Left patella fracture 07/19/2011    Kermit Balo, PTA 11/26/16 8:46 AM  [facility] [address]  [address] [place], [place], [address] Phone: [phone]   Fax:  [phone]  Name: Rien Marland. MRN: [id number] Date of Birth: June 03, 1951

## 2016-11-30 ENCOUNTER — Ambulatory Visit: Payer: Medicare Other | Attending: Surgical

## 2016-11-30 DIAGNOSIS — R262 Difficulty in walking, not elsewhere classified: Secondary | ICD-10-CM | POA: Insufficient documentation

## 2016-11-30 DIAGNOSIS — M25551 Pain in right hip: Secondary | ICD-10-CM | POA: Insufficient documentation

## 2016-11-30 DIAGNOSIS — R2689 Other abnormalities of gait and mobility: Secondary | ICD-10-CM | POA: Insufficient documentation

## 2016-11-30 NOTE — Therapy (Signed)
[facility] Outpatient Rehabilitation [facility] [address]  [address] [place], [place], [address] Phone: [phone]   Fax:  [phone]  Physical Therapy Treatment  Patient Details  Name: [name]. MRN: [id number] Date of Birth: July 13, 1950 Referring Provider: Dr. Forest Becker   Encounter Date: 11/30/2016      PT End of Session - 11/30/16 1655    Visit Number 6   Number of Visits 12   Date for PT Re-Evaluation 12/16/16   Authorization Type Medicare   PT Start Time 1656   PT Stop Time 1746   PT Time Calculation (min) 50 min   Activity Tolerance Patient tolerated treatment well   Behavior During Therapy [facility] for tasks assessed/performed      Past Medical History:  Diagnosis Date  . Arthritis   . Closed intertrochanteric fracture of left hip (HCC) 07/20/2011  . Gout   . Left patella fracture 07/19/2011  . S/P gastric bypass 07/19/2011    Past Surgical History:  Procedure Laterality Date  . COMPRESSION HIP SCREW  07/20/2011   Procedure: COMPRESSION HIP;  Surgeon: Eulas Post, MD;  Location: [facility] OR;  Service: Orthopedics;  Laterality: Left;  . ELECTROPHYSIOLOGIC STUDY N/A 01/08/2016   Procedure: /SVT Ablation;  Surgeon: Marinus Maw, MD;  Location: [facility] INVASIVE CV LAB;  Service: Cardiovascular;  Laterality: N/A;  . KNEE SURGERY    . ROUX-EN-Y PROCEDURE  2008   carpal tunnell  l rcr    There were no vitals filed for this visit.      Subjective Assessment - 11/30/16 1657    Subjective Still having hip pain with walking.     Patient Stated Goals get back to fishing   Currently in Pain? No/denies   Pain Score 0-No pain   Multiple Pain Sites No            [facility] PT Assessment - 11/30/16 1717      Assessment   Next MD Visit 6.20                     [facility] Adult PT Treatment/Exercise - 11/30/16 1701      Knee/Hip Exercises: Stretches   Passive Hamstring Stretch Right;2 reps;30 seconds   Passive Hamstring Stretch  Limitations with strap    Quad Stretch Right;30 seconds;2 reps   Lobbyist Limitations with strap   Piriformis Stretch Right;30 seconds;2 reps   Piriformis Stretch Limitations Figure-4, KTOS    Other Knee/Hip Stretches R ITB stretch with strap x 30 sec     Knee/Hip Exercises: Standing   Forward Lunges Right;Left;1 set;15 reps   Other Standing Knee Exercises Side-stepping with yellow TB around ankles 2 x 30 ft      Knee/Hip Exercises: Seated   Long Arc Quad 2 sets;Right;15 reps   Long Arc Quad Weight 2 lbs.   Long Arc [employer] Limitations adduction ball squeeze      Knee/Hip Exercises: Supine   Bridges Both;15 reps   Bridges with Clamshell Both;Strengthening;1 set;20 reps   Straight Leg Raises 3 sets;5 reps;Right   Straight Leg Raises Limitations without assistance     Knee/Hip Exercises: Sidelying   Hip ABduction Right;20 reps   Clams R LE x 15 with red TB around knees     Manual Therapy   Manual Therapy Myofascial release;Passive ROM   Manual therapy comments supine and sidelying   Myofascial Release TPR to R glute/piriformis   Passive ROM R groin and sidelying  ITB, and piriformis stretch with therapist x 1 min each                PT Education - 11/30/16 1759    Education provided Yes   Education Details ITB stretch, prone quad stretch, hip flexor stretch in mod thomas    Person(s) Educated Patient   Methods Explanation;Demonstration;Verbal cues;Handout   Comprehension Verbalized understanding;Returned demonstration;Verbal cues required;Need further instruction             PT Long Term Goals - 11/10/16 1631      PT LONG TERM GOAL #1   Title patient to be independent with advanced HEP (12/16/16)   Status On-going     PT LONG TERM GOAL #2   Title Patient to demonstrate proper gait mechaincs with good heel toe gait pattern with no evidence of instability (12/16/16)   Status On-going     PT LONG TERM GOAL #3   Title Patient to improve R hip strength to >/=  4+/5 (12/16/16)   Status On-going     PT LONG TERM GOAL #4   Title Patient to report ability to safely enter/exit fishing boat without instability (12/16/16)   Status On-going               Plan - 11/30/16 1702    Clinical Impression Statement Pt. doing well today seen to start treatment with improved gait mechanics and wt. shift without AD.  Pt. noting he is still tender in R buttocks area with pressure however has been performing self-release with ball to glutes.  Pt. with improved tolerance for strengthening therex today able to perform full ROM SLR without assistance.  Treatment focusing on gentle hip/knee strengthening and stretching to R-sided LE musculature, which remains very tight.  HEP updated to include more LE stretching.     PT Treatment/Interventions ADLs/Self Care Home Management;Cryotherapy;Electrical Stimulation;Moist Heat;Ultrasound;Neuromuscular re-education;Balance training;Therapeutic exercise;Therapeutic activities;Functional mobility training;Stair training;Gait training;Patient/family education;Manual techniques;Passive range of motion;Vasopneumatic Device;Taping;Dry needling   PT Next Visit Plan Monitor respones to updated HEP; Hip strengthening, stretching      Patient will benefit from skilled therapeutic intervention in order to improve the following deficits and impairments:  Abnormal gait, Decreased activity tolerance, Decreased balance, Decreased mobility, Decreased strength, Difficulty walking, Pain  Visit Diagnosis: Pain in right hip  Difficulty in walking, not elsewhere classified  Other abnormalities of gait and mobility     Problem List Patient Active Problem List   Diagnosis Date Noted  . SVT (supraventricular tachycardia) (HCC) 01/08/2016  . Hammer toe of right foot 04/20/2014  . Other hammer toe (acquired) 03/19/2014  . Keratoma 03/19/2014  . Pain in lower limb 03/19/2014  . Neuropathy 03/19/2014  . Closed intertrochanteric fracture of  left hip (HCC) 07/20/2011  . 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 Left patella fracture 07/19/2011    Kermit Balo, PTA 11/30/16 6:06 PM   [facility] Outpatient Rehabilitation [facility] [address]  [address] [place], [place], [address] Phone: [phone]   Fax:  [phone]  Name: Tahji Renningers. MRN: [id number] Date of Birth: [birth year]

## 2016-12-02 ENCOUNTER — Ambulatory Visit: Payer: Medicare Other

## 2016-12-03 ENCOUNTER — Ambulatory Visit: Payer: Medicare Other | Admitting: Physical Therapy

## 2016-12-07 ENCOUNTER — Emergency Department (HOSPITAL_COMMUNITY): Payer: Worker's Compensation

## 2016-12-07 ENCOUNTER — Encounter (HOSPITAL_COMMUNITY): Payer: Self-pay | Admitting: Emergency Medicine

## 2016-12-07 ENCOUNTER — Emergency Department (HOSPITAL_COMMUNITY)
Admission: EM | Admit: 2016-12-07 | Discharge: 2016-12-07 | Disposition: A | Discharge status: Home / Self Care | Payer: Worker's Compensation | Attending: Emergency Medicine | Admitting: Emergency Medicine

## 2016-12-07 ENCOUNTER — Ambulatory Visit: Payer: Medicare Other

## 2016-12-07 DIAGNOSIS — Y999 Unspecified external cause status: Secondary | ICD-10-CM | POA: Insufficient documentation

## 2016-12-07 DIAGNOSIS — S0101XA Laceration without foreign body of scalp, initial encounter: Secondary | ICD-10-CM | POA: Insufficient documentation

## 2016-12-07 DIAGNOSIS — S0990XA Unspecified injury of head, initial encounter: Secondary | ICD-10-CM | POA: Diagnosis not present

## 2016-12-07 DIAGNOSIS — Y9289 Other specified places as the place of occurrence of the external cause: Secondary | ICD-10-CM | POA: Insufficient documentation

## 2016-12-07 DIAGNOSIS — W1839XA Other fall on same level, initial encounter: Secondary | ICD-10-CM | POA: Insufficient documentation

## 2016-12-07 DIAGNOSIS — S0181XA Laceration without foreign body of other part of head, initial encounter: Secondary | ICD-10-CM | POA: Insufficient documentation

## 2016-12-07 DIAGNOSIS — Y9301 Activity, walking, marching and hiking: Secondary | ICD-10-CM | POA: Insufficient documentation

## 2016-12-07 DIAGNOSIS — W19XXXA Unspecified fall, initial encounter: Secondary | ICD-10-CM

## 2016-12-07 DIAGNOSIS — S0190XA Unspecified open wound of unspecified part of head, initial encounter: Secondary | ICD-10-CM | POA: Diagnosis not present

## 2016-12-07 DIAGNOSIS — S6992XA Unspecified injury of left wrist, hand and finger(s), initial encounter: Secondary | ICD-10-CM | POA: Insufficient documentation

## 2016-12-07 MED ORDER — LIDOCAINE HCL (PF) 1 % IJ SOLN
30.0000 mL | Freq: Once | INTRAMUSCULAR | Status: AC
  Administered 2016-12-07: 30 mL via INTRADERMAL
  Filled 2016-12-07: qty 30

## 2016-12-07 MED ORDER — ONDANSETRON 4 MG PO TBDP
4.0000 mg | ORAL_TABLET | Freq: Once | ORAL | Status: AC
  Administered 2016-12-07: 4 mg via ORAL
  Filled 2016-12-07: qty 1

## 2016-12-07 MED ORDER — HYDROMORPHONE HCL 1 MG/ML IJ SOLN
0.5000 mg | Freq: Once | INTRAMUSCULAR | Status: AC
  Administered 2016-12-07: 0.5 mg via INTRAVENOUS
  Filled 2016-12-07: qty 1

## 2016-12-07 MED ORDER — HYDROCODONE-ACETAMINOPHEN 5-325 MG PO TABS: 1.0000 | ORAL_TABLET | Freq: Four times a day (QID) | ORAL | 0 refills | Status: AC | PRN

## 2016-12-07 MED ORDER — HYDROCODONE-ACETAMINOPHEN 5-325 MG PO TABS
1.0000 | ORAL_TABLET | Freq: Once | ORAL | Status: AC
  Administered 2016-12-07: 1 via ORAL
  Filled 2016-12-07: qty 1

## 2016-12-07 MED ORDER — MORPHINE SULFATE (PF) 4 MG/ML IV SOLN
2.0000 mg | Freq: Once | INTRAVENOUS | Status: AC
Start: 2016-12-07 — End: 2016-12-07
  Administered 2016-12-07: 2 mg via INTRAVENOUS
  Filled 2016-12-07: qty 1

## 2016-12-07 NOTE — ED Provider Notes (Signed)
Received sign out from PA Pisciotta. See provider note for full history and physical. Briefly patient is a 66 year old male with history of gout, arthritis, hip fracture and patellar fracture with chief complaint left elbow pain and headache status post mechanical fall up steps in which he hit his head but denies loss of consciousness. Imaging of the head, C-spine, and elbow negative for acute processes. He has received Vicodin, morphine, and Dilaudid for pain control in the ED.  Physical Exam  BP (!) 152/82   Pulse 71   Temp 98.2 F (36.8 C) (Oral)   Resp 18   SpO2 98%   Physical Exam  Constitutional: He is oriented to person, place, and time. He appears well-developed and well-nourished. No distress.  HENT:  Head: Normocephalic.  Right Ear: External ear normal.  Left Ear: External ear normal.  Mouth/Throat: Oropharynx is clear and moist.  No battle signs, no raccoons eyes, no rhinorrhea. 3 staples applied to laceration on left parietal scalp. No deformity crepitus noted.  Eyes: Conjunctivae are normal. Pupils are equal, round, and reactive to light. Right eye exhibits no discharge. Left eye exhibits no discharge. No scleral icterus.  3 simple interrupted sutures placed to laceration above left eyebrow. No pain with EOMs  Neck: Normal range of motion. No JVD present. No tracheal deviation present.  No midline spine TTP no paraspinal muscle tenderness  Cardiovascular: Normal rate and intact distal pulses.   2+ radial and DP/PT pulses bl, negative Homan's bl   Pulmonary/Chest: Effort normal and breath sounds normal. He exhibits no tenderness.  Abdominal: Soft. He exhibits no distension. There is no tenderness.  Musculoskeletal: He exhibits edema and tenderness.       Left wrist: He exhibits decreased range of motion, tenderness, bony tenderness and swelling. He exhibits no crepitus and no laceration.       Cervical back: Normal.       Thoracic back: Normal.       Lumbar back: Normal.   Limited range of motion of left wrist, decreased strength secondary to pain. Snuffbox tenderness present, as well as generalized tenderness to palpation of the dorsum of the left wrist.   Neurological: He is alert and oriented to person, place, and time. No cranial nerve deficit or sensory deficit.  Fluent speech, no facial droop, sensation intact to soft touch at BUE and BLE  Skin: Skin is warm and dry. Capillary refill takes less than 2 seconds. He is not diaphoretic.    ED Course  Procedures  MDM Wrist x-ray negative for fracture or dislocation. However with snuffbox tenderness present, nurse will apply thumb spica and patient will follow-up with orthopedic hand surgery for reevaluation and repeat x-ray. Discussed pain control and care with ibuprofen, ice, heat, and narcotics for severe breakthrough pain. Discussed wound care for sutures and staples, advised of return in 7 days for suture removal in 10 days for staple removal or follow-up with primary care for this. Discussed indications for return to the ED sooner. Pt verbalized understanding of and agreement with plan and is safe for discharge home at this time.        Jeanie Sewer, PA-C 12/07/16 1715    Arby Barrette, MD 12/10/16 1635

## 2016-12-07 NOTE — ED Notes (Signed)
Patient transported to X-ray 

## 2016-12-07 NOTE — ED Notes (Signed)
0.5mg  dilaudid wasted in sink with Greg Cutter RN

## 2016-12-07 NOTE — ED Notes (Signed)
Patient Alert and oriented X4. Stable and ambulatory. Patient verbalized understanding of the discharge instructions.  Patient belongings were taken by the patient.  

## 2016-12-07 NOTE — Discharge Instructions (Addendum)
Keep wound dry and do not remove dressing for 24 hours if possible. After that, wash gently morning and night (every 12 hours) with soap and water. Use a topical antibiotic ointment and cover with a bandaid or gauze.    Do NOT use rubbing alcohol or hydrogen peroxide, do not soak the area   Present to your primary care doctor or the urgent care of your choice, or the ED for suture removal in 7  days.   Every attempt was made to remove foreign body (contaminants) from the wound.  However, there is always a chance that some may remain in the wound. This can  increase your risk of infection.   If you see signs of infection (warmth, redness, tenderness, pus, sharp increase in pain, fever, red streaking in the skin) immediately return to the emergency department.   After the wound heals fully, apply sunscreen for 6-12 months to minimize scarring.   You may use Norco as needed for severe pain of the wrist. Take 600 mg of ibuprofen every 6 hours for the next 3-4 days for pain. Apply ice or heat to the affected wrist. Follow-up with orthopedic hand surgery for reevaluation and repeat x-ray.

## 2016-12-07 NOTE — ED Triage Notes (Signed)
Patient from work with [facility] after falling while walking up some stairs.  Patient states he tripped at the last step and fell onto the stairwell landing and hit the wall with his head.  EMS reports lacerations to posterior and anterior head, bleeding controlled.  Patient alert and oriented at this time.  18g in left hand.  EMS estimates blood loss 50-113mL.

## 2016-12-07 NOTE — ED Provider Notes (Signed)
[facility]-EMERGENCY DEPT Provider Note   CSN: [id number] Arrival date & time: 12/07/16  1151     History   Chief Complaint Chief Complaint  Patient presents with  . Fall    HPI   Blood pressure (!) 163/97, pulse 72, temperature 98.2 F (36.8 C), temperature source Oral, resp. rate 10, SpO2 99 %.  Daniel Talarico. is a 66 y.o. male complaining of left elbow and headache with associated scalp and facial laceration status post fall just prior to arrival. Patient states he was walking up the stairs his toe caught on the step and he fell down onto the landing. He did not fall down the steps. There was no loss of consciousness. He is not anticoagulated, no loss of consciousness, nausea, vomiting, numbness, weakness, tingling, cervicalgia, chest pain, abdominal pain and difficulty moving major joints decide from the left elbow. States his last tetanus shot was within the last 6 months.Patient is right-hand-dominant and works as a Building services engineer.  Past Medical History:  Diagnosis Date  . Arthritis   . Closed intertrochanteric fracture of left hip (HCC) 07/20/2011  . Gout   . Left patella fracture 07/19/2011  . 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 SVT (supraventricular tachycardia) (HCC) 01/08/2016  . Hammer toe of right foot 04/20/2014  . Other hammer toe (acquired) 03/19/2014  . Keratoma 03/19/2014  . Pain in lower limb 03/19/2014  . Neuropathy 03/19/2014  . Closed intertrochanteric fracture of left hip (HCC) 07/20/2011  . 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 Left patella fracture 07/19/2011    Past Surgical History:  Procedure Laterality Date  . COMPRESSION HIP SCREW  07/20/2011   Procedure: COMPRESSION HIP;  Surgeon: Eulas Post, MD;  Location: [facility] OR;  Service: Orthopedics;  Laterality: Left;  . ELECTROPHYSIOLOGIC STUDY N/A 01/08/2016   Procedure: /SVT Ablation;  Surgeon: Marinus Maw, MD;  Location: [facility] INVASIVE CV LAB;  Service:  Cardiovascular;  Laterality: N/A;  . KNEE SURGERY    . ROUX-EN-Y PROCEDURE  2008   carpal tunnell  l rcr       Home Medications    Prior to Admission medications   Medication Sig Start Date End Date Taking? Authorizing Provider  gabapentin (NEURONTIN) 300 MG capsule Take 600 mg by mouth 2 (two) times daily.    [provider]  predniSONE (DELTASONE) 10 MG tablet Take 10 mg by mouth daily. 02/04/16   [provider]  traMADol (ULTRAM) 50 MG tablet Take 1 tablet (50 mg total) by mouth every 6 (six) hours as needed. For pain 07/05/13   [name], DPM    Family History Family History  Problem Relation Age of Onset  . Heart disease Father        Died in his 43s of a heart attack    Social History Social History  Substance Use Topics  . Smoking status: Never Smoker  . Smokeless tobacco: Never Used  . Alcohol use Yes     Comment: occasional     Allergies   Patient has no known allergies.   Review of Systems Review of Systems  10 systems reviewed and found to be negative, except as noted in the HPI.   Physical Exam Updated Vital Signs BP 133/83   Pulse 69   Temp 98.2 F (36.8 C) (Oral)   Resp 11   SpO2 95%   Physical Exam  Constitutional: He is oriented to person, place,  and time. He appears well-developed and well-nourished. No distress.  HENT:  Head: Normocephalic.  Mouth/Throat: Oropharynx is clear and moist.  +2 cm laceration above left eyebrow, 2 cm laceration on left parietal scalp, bleeding is controlled.   No hemotympanum, battle signs or raccoon's eyes  No crepitance or tenderness to palpation along the orbital rim.  EOMI intact with no pain or diplopia  No abnormal otorrhea or rhinorrhea. Nasal septum midline.  No intraoral trauma.     Eyes: Conjunctivae and EOM are normal. Pupils are equal, round, and reactive to light.  Neck: Normal range of motion.  No midline C-spine  tenderness to palpation or step-offs  appreciated. Patient has full range of motion without pain.  Grip strength, biceps, triceps 5/5 bilaterally;  can differentiate between pinprick and light touch bilaterally.   Cardiovascular: Normal rate, regular rhythm and intact distal pulses.   Pulmonary/Chest: Effort normal and breath sounds normal.  Abdominal: Soft. There is no tenderness.  Musculoskeletal: Normal range of motion. He exhibits tenderness.  Tenderness to palpation along the medial left elbow full range of motion to elbow with pain. Left wrist with positive snuffbox tenderness to palpation and swelling.  Neurological: He is alert and oriented to person, place, and time.  Skin: He is not diaphoretic.  Psychiatric: He has a normal mood and affect.  Nursing note and vitals reviewed.    ED Treatments / Results  Labs (all labs ordered are listed, but only abnormal results are displayed) Labs Reviewed - No data to display  EKG  EKG Interpretation None       Radiology Dg Elbow Complete Left  Result Date: 12/07/2016 CLINICAL DATA:  Pain following fall EXAM: LEFT ELBOW - COMPLETE 3+ VIEW COMPARISON:  None. FINDINGS: Frontal, lateral, and bilateral oblique views were obtained. No fracture or dislocation. No joint effusion. The joint spaces appear normal. No erosive change. There is extensive brachial and radial artery calcification. IMPRESSION: No fracture or dislocation. No appreciable arthropathy. 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 Electronically Signed   By: Bretta Bang III M.D.   On: 12/07/2016 13:44   Ct Head Wo Contrast  Result Date: 12/07/2016 CLINICAL DATA:  Status post fall.  Laceration to the frontal region. EXAM: CT HEAD WITHOUT CONTRAST CT CERVICAL SPINE WITHOUT CONTRAST TECHNIQUE: Multidetector CT imaging of the head and cervical spine was performed following the standard protocol without intravenous contrast. Multiplanar CT image reconstructions of the cervical spine were also  generated. COMPARISON:  None. FINDINGS: CT HEAD FINDINGS Brain: No evidence of acute infarction, hemorrhage, hydrocephalus, extra-axial collection or mass lesion/mass effect. Vascular: No hyperdense vessel or unexpected calcification. Skull: No osseous abnormality. Sinuses/Orbits: Left maxillary sinus air-fluid level. Mucosal thickening in bilateral ethmoid sinuses and bilateral maxillary sinuses. Mucosal thickening in bilateral frontoethmoidal recesses. Visualized mastoid sinuses are clear. Visualized orbits demonstrate no focal abnormality. Other: None CT CERVICAL SPINE FINDINGS Alignment: Normal. Skull base and vertebrae: No acute fracture. No primary bone lesion or focal pathologic process. Soft tissues and spinal canal: No prevertebral fluid or swelling. No visible canal hematoma. Disc levels: Degenerative disc disease with disc height loss at C3-4, C4-5, C6-7 and C7-T1. Osseous fusion of the C5-C6 vertebral bodies and posterior elements. Broad-based disc bulge at C3-4. Broad-based disc osteophyte complex at C4-5 with bilateral uncovertebral degenerative changes and bilateral foraminal narrowing. Broad-based disc osteophyte complex at C6-7 with bilateral foraminal narrowing. Upper chest: Lung apices are clear. Other: No fluid collection or hematoma. IMPRESSION: 1. No acute intracranial pathology. 2.  No acute osseous injury of the cervical spine. 3. Cervical spine spondylosis as described above. Electronically Signed   By: Elige Ko   On: 12/07/2016 13:36   Ct Cervical Spine Wo Contrast  Result Date: 12/07/2016 CLINICAL DATA:  Status post fall.  Laceration to the frontal region. EXAM: CT HEAD WITHOUT CONTRAST CT CERVICAL SPINE WITHOUT CONTRAST TECHNIQUE: Multidetector CT imaging of the head and cervical spine was performed following the standard protocol without intravenous contrast. Multiplanar CT image reconstructions of the cervical spine were also generated. COMPARISON:  None. FINDINGS: CT HEAD  FINDINGS Brain: No evidence of acute infarction, hemorrhage, hydrocephalus, extra-axial collection or mass lesion/mass effect. Vascular: No hyperdense vessel or unexpected calcification. Skull: No osseous abnormality. Sinuses/Orbits: Left maxillary sinus air-fluid level. Mucosal thickening in bilateral ethmoid sinuses and bilateral maxillary sinuses. Mucosal thickening in bilateral frontoethmoidal recesses. Visualized mastoid sinuses are clear. Visualized orbits demonstrate no focal abnormality. Other: None CT CERVICAL SPINE FINDINGS Alignment: Normal. Skull base and vertebrae: No acute fracture. No primary bone lesion or focal pathologic process. Soft tissues and spinal canal: No prevertebral fluid or swelling. No visible canal hematoma. Disc levels: Degenerative disc disease with disc height loss at C3-4, C4-5, C6-7 and C7-T1. Osseous fusion of the C5-C6 vertebral bodies and posterior elements. Broad-based disc bulge at C3-4. Broad-based disc osteophyte complex at C4-5 with bilateral uncovertebral degenerative changes and bilateral foraminal narrowing. Broad-based disc osteophyte complex at C6-7 with bilateral foraminal narrowing. Upper chest: Lung apices are clear. Other: No fluid collection or hematoma. IMPRESSION: 1. No acute intracranial pathology. 2. No acute osseous injury of the cervical spine. 3. Cervical spine spondylosis as described above. Electronically Signed   By: Elige Ko   On: 12/07/2016 13:36    Procedures .Marland KitchenLaceration Repair Date/Time: 12/07/2016 2:29 PM Performed by: Wynetta Emery Authorized by: Wynetta Emery   Consent:    Consent obtained:  Verbal   Consent given by:  Patient Anesthesia (see MAR for exact dosages):    Anesthesia method:  Local infiltration   Local anesthetic:  Lidocaine 1% w/o epi Laceration details:    Location:  Scalp   Scalp location:  L temporal   Length (cm):  2 Repair type:    Repair type:  Simple Pre-procedure details:    Preparation:   Patient was prepped and draped in usual sterile fashion Exploration:    Wound exploration: entire depth of wound probed and visualized     Contaminated: no   Treatment:    Area cleansed with:  Saline   Amount of cleaning:  Standard   Irrigation solution:  Sterile saline   Irrigation volume:  250   Irrigation method:  Syringe   Visualized foreign bodies/material removed: no   Skin repair:    Repair method:  Staples   Number of staples:  3 Approximation:    Approximation:  Close Post-procedure details:    Dressing:  Antibiotic ointment   Patient tolerance of procedure:  Tolerated well, no immediate complications .Marland KitchenLaceration Repair Date/Time: 12/07/2016 2:30 PM Performed by: Wynetta Emery Authorized by: Wynetta Emery   Consent:    Consent obtained:  Verbal   Consent given by:  Patient Laceration details:    Location:  Face   Face location:  L eyebrow   Length (cm):  2 Repair type:    Repair type:  Intermediate Pre-procedure details:    Preparation:  Patient was prepped and draped in usual sterile fashion Exploration:    Contaminated: no   Treatment:    Area  cleansed with:  Saline   Amount of cleaning:  Extensive   Irrigation solution:  Sterile saline   Irrigation volume:  500 cc   Irrigation method:  Syringe   Visualized foreign bodies/material removed: no   Skin repair:    Repair method:  Sutures   Suture size:  5-0   Suture technique:  Simple interrupted   (including critical care time)  Medications Ordered in ED Medications  HYDROmorphone (DILAUDID) injection 0.5 mg (not administered)  ondansetron (ZOFRAN-ODT) disintegrating tablet 4 mg (4 mg Oral Given 12/07/16 1354)  HYDROcodone-acetaminophen (NORCO/VICODIN) 5-325 MG per tablet 1 tablet (1 tablet Oral Given 12/07/16 1353)  lidocaine (PF) (XYLOCAINE) 1 % injection 30 mL (30 mLs Intradermal Given by Other 12/07/16 1353)  morphine 4 MG/ML injection 2 mg (2 mg Intravenous Given 12/07/16 1429)     Initial  Impression / Assessment and Plan / ED Course  I have reviewed the triage vital signs and the nursing notes.  Pertinent labs & imaging results that were available during my care of the patient were reviewed by me and considered in my medical decision making (see chart for details).     Vitals:   12/07/16 1158 12/07/16 1230  BP: (!) 163/97 133/83  Pulse: 72 69  Resp: 10 11  Temp: 98.2 F (36.8 C)   TempSrc: Oral   SpO2: 99% 95%    Medications  HYDROmorphone (DILAUDID) injection 0.5 mg (not administered)  ondansetron (ZOFRAN-ODT) disintegrating tablet 4 mg (4 mg Oral Given 12/07/16 1354)  HYDROcodone-acetaminophen (NORCO/VICODIN) 5-325 MG per tablet 1 tablet (1 tablet Oral Given 12/07/16 1353)  lidocaine (PF) (XYLOCAINE) 1 % injection 30 mL (30 mLs Intradermal Given by Other 12/07/16 1353)  morphine 4 MG/ML injection 2 mg (2 mg Intravenous Given 12/07/16 1429)    Daniel Moscone. is 66 y.o. male presenting with facial lacerations and head trauma status post mechanical fall earlier in the day. He is not anticoagulated. Neurologic exam is grossly nonfocal. Tetanus is up-to-date. Will obtain CT head, C-spine and plain films of left elbow.  CT and x-ray of left elbow are negative. Wound is cleaned and closed. Patient will need x-ray of wrist: He has swelling and snuffbox tenderness with reduced grip strength. Case signed out to [facility], pending x-ray.     Final Clinical Impressions(s) / ED Diagnoses   Final diagnoses:  Facial laceration, initial encounter  Fall, initial encounter    New Prescriptions New Prescriptions   No medications on file     Kaylyn Lim 12/07/16 1556    Arby Barrette, MD 12/10/16 1635

## 2016-12-07 NOTE — ED Provider Notes (Signed)
Medical screening examination/treatment/procedure(s) were conducted as a shared visit with non-physician practitioner(s) and myself.  I personally evaluated the patient during the encounter.   EKG Interpretation None     Patient mechanical fall going up stairs. He reports he fell forward and hit his head against the wall and his left elbow. He reports most of his pain is in the left elbow.  Patient is alert with GCS of 15. He does have facial laceration left brow and left parietal scalp. Left elbow has no deformity. He endorses pain. No gross effusion. Range of motion intact.  I agree with plan of management.   Arby Barrette, MD 12/07/16 1324

## 2016-12-07 NOTE — Progress Notes (Signed)
Orthopedic Tech Progress Note Patient Details:  [name] [birth year] [id number]  Ortho Devices Type of Ortho Device: Thumb velcro splint Ortho Device/Splint Location: applied thum velcro splint to pt left hand.  pt tolerated well.  Left hand.  wife at bedside.   Ortho Device/Splint Interventions: Application, Adjustment   Alvina Chou 12/07/2016, 5:06 PM

## 2016-12-07 NOTE — ED Notes (Signed)
Pt walking in room, assisted back to bed. PA at bedside

## 2016-12-09 ENCOUNTER — Ambulatory Visit: Payer: Medicare Other | Admitting: Physical Therapy

## 2016-12-14 ENCOUNTER — Ambulatory Visit: Payer: Medicare Other | Admitting: Physical Therapy

## 2016-12-14 DIAGNOSIS — M810 Age-related osteoporosis without current pathological fracture: Secondary | ICD-10-CM | POA: Diagnosis not present

## 2016-12-16 ENCOUNTER — Ambulatory Visit: Payer: Medicare Other | Admitting: Physical Therapy

## 2016-12-16 ENCOUNTER — Encounter (HOSPITAL_BASED_OUTPATIENT_CLINIC_OR_DEPARTMENT_OTHER): Payer: Self-pay | Admitting: Emergency Medicine

## 2016-12-16 ENCOUNTER — Emergency Department (HOSPITAL_BASED_OUTPATIENT_CLINIC_OR_DEPARTMENT_OTHER)
Admission: EM | Admit: 2016-12-16 | Discharge: 2016-12-16 | Disposition: A | Discharge status: Home / Self Care | Payer: Medicare Other | Attending: Emergency Medicine | Admitting: Emergency Medicine

## 2016-12-16 DIAGNOSIS — M25551 Pain in right hip: Secondary | ICD-10-CM

## 2016-12-16 DIAGNOSIS — S0993XD Unspecified injury of face, subsequent encounter: Secondary | ICD-10-CM | POA: Diagnosis present

## 2016-12-16 DIAGNOSIS — Y939 Activity, unspecified: Secondary | ICD-10-CM | POA: Insufficient documentation

## 2016-12-16 DIAGNOSIS — Z4802 Encounter for removal of sutures: Secondary | ICD-10-CM

## 2016-12-16 DIAGNOSIS — S0000XD Unspecified superficial injury of scalp, subsequent encounter: Secondary | ICD-10-CM | POA: Diagnosis not present

## 2016-12-16 DIAGNOSIS — R262 Difficulty in walking, not elsewhere classified: Secondary | ICD-10-CM | POA: Diagnosis not present

## 2016-12-16 DIAGNOSIS — Y999 Unspecified external cause status: Secondary | ICD-10-CM | POA: Insufficient documentation

## 2016-12-16 DIAGNOSIS — Z79899 Other long term (current) drug therapy: Secondary | ICD-10-CM | POA: Insufficient documentation

## 2016-12-16 DIAGNOSIS — X58XXXA Exposure to other specified factors, initial encounter: Secondary | ICD-10-CM | POA: Diagnosis not present

## 2016-12-16 DIAGNOSIS — Y929 Unspecified place or not applicable: Secondary | ICD-10-CM | POA: Insufficient documentation

## 2016-12-16 DIAGNOSIS — R2689 Other abnormalities of gait and mobility: Secondary | ICD-10-CM | POA: Diagnosis not present

## 2016-12-16 NOTE — ED Notes (Signed)
Pt verbalized understanding of discharge instructions and denies any further questions at this time.   

## 2016-12-16 NOTE — Therapy (Signed)
[facility] [address]  [address] [place], [place], [address] Phone: [phone]   Fax:  [phone]  Physical Therapy Treatment  Patient Details  Name: [name]. MRN: [id number] Date of Birth: Apr 28, 1951 Referring Provider: Dr. Forest Becker  Encounter Date: 12/16/2016      PT End of Session - 12/16/16 1653    Visit Number 7   Number of Visits 16   Date for PT Re-Evaluation 01/27/17   Authorization Type Medicare   PT Start Time 1650   PT Stop Time 1735   PT Time Calculation (min) 45 min   Activity Tolerance Patient tolerated treatment well   Behavior During Therapy [facility] for tasks assessed/performed      Past Medical History:  Diagnosis Date  . Arthritis   . Closed intertrochanteric fracture of left hip (HCC) 07/20/2011  . Gout   . Left patella fracture 07/19/2011  . S/P gastric bypass 07/19/2011    Past Surgical History:  Procedure Laterality Date  . COMPRESSION HIP SCREW  07/20/2011   Procedure: COMPRESSION HIP;  Surgeon: Eulas Post, MD;  Location: [facility] OR;  Service: Orthopedics;  Laterality: Left;  . ELECTROPHYSIOLOGIC STUDY N/A 01/08/2016   Procedure: /SVT Ablation;  Surgeon: Marinus Maw, MD;  Location: [facility] INVASIVE CV LAB;  Service: Cardiovascular;  Laterality: N/A;  . KNEE SURGERY    . ROUX-EN-Y PROCEDURE  2008   carpal tunnell  l rcr    There were no vitals filed for this visit.      Subjective Assessment - 12/16/16 1650    Subjective Feeling well today - in wrist brace due to recent fall   Patient Stated Goals get back to fishing   Currently in Pain? No/denies   Pain Score 0-No pain            [facility] PT Assessment - 12/16/16 1656      Assessment   Medical Diagnosis R hip fracture s/p IM nailing   Referring Provider Dr. Forest Becker   Onset Date/Surgical Date 10/05/16   Next MD Visit 01/05/17     Strength   Right/Left Hip Right   Right Hip Flexion 4+/5   Right Hip  ABduction 4+/5   Right Hip ADduction 5/5   Right/Left Knee Right;Left   Right Knee Flexion 5/5   Right Knee Extension 5/5   Left Knee Flexion 5/5   Left Knee Extension 5/5                     [facility] Adult PT Treatment/Exercise - 12/16/16 0001      Ambulation/Gait   Stairs Yes   Stairs Assistance 6: Modified independent (Device/Increase time)   Stair Management Technique One rail Right;Step to pattern   Number of Stairs 14   Height of Stairs 8   Gait Comments heavy use of UE to ascend stairs     Knee/Hip Exercises: Machines for Strengthening   Cybex Knee Extension 35# B LE con x 15 reps; 35# B con/R ecc x 15 reps   Cybex Knee Flexion 35# B LE x 15 reps     Knee/Hip Exercises: Seated   Sit to Sand 15 reps;with UE support  standing on AirEx     Knee/Hip Exercises: Supine   Bridges Both;15 reps   Straight Leg Raises Strengthening;Right;15 reps   Straight Leg Raises Limitations no extensor lag  PT Long Term Goals - 12/16/16 1654      PT LONG TERM GOAL #1   Title patient to be independent with advanced HEP (12/16/16)   Status On-going     PT LONG TERM GOAL #2   Title Patient to demonstrate proper gait mechaincs with good heel toe gait pattern with no evidence of instability (12/16/16)   Status On-going  slight antalgic gait pattern     PT LONG TERM GOAL #3   Title Patient to improve R hip strength to >/= 4+/5 (12/16/16)   Status Achieved     PT LONG TERM GOAL #4   Title Patient to report ability to safely enter/exit fishing boat without instability (12/16/16)   Status On-going  due to fear from recent fall     PT LONG TERM GOAL #5   Title Patient to demonstrate ability to ascend/descend 1 flight of steps with no evidence of instability   Status New               Plan - 12/16/16 1711    Clinical Impression Statement Mr. Curie doing well today - first time to PT since fall with resultant scalp laceration, facial  bruising, and wrist pain. Patient has progress well thus far PT, with ability to perform SLR without extensor lag, is walking wihtout assistive devicce - however does continue to demonstrate some poor gait mechanics with slight limp and antalgic gait pattern. Patient doing well with all strengthening, however, will continue to benefit from PT to continue to work on balance, strength, and gait to allow for improved functional mobility and quality of life.    PT Treatment/Interventions ADLs/Self Care Home Management;Cryotherapy;Electrical Stimulation;Moist Heat;Ultrasound;Neuromuscular re-education;Balance training;Therapeutic exercise;Therapeutic activities;Functional mobility training;Stair training;Gait training;Patient/family education;Manual techniques;Passive range of motion;Vasopneumatic Device;Taping;Dry needling   PT Next Visit Plan Monitor respones to updated HEP; Hip strengthening, stretching   Consulted and Agree with Plan of Care Patient      Patient will benefit from skilled therapeutic intervention in order to improve the following deficits and impairments:  Abnormal gait, Decreased activity tolerance, Decreased balance, Decreased mobility, Decreased strength, Difficulty walking, Pain  Visit Diagnosis: Pain in right hip - Plan: PT plan of care cert/re-cert  Difficulty in walking, not elsewhere classified - Plan: PT plan of care cert/re-cert  Other abnormalities of gait and mobility - Plan: PT plan of care cert/re-cert     Problem List Patient Active Problem List   Diagnosis Date Noted  . SVT (supraventricular tachycardia) (HCC) 01/08/2016  . Hammer toe of right foot 04/20/2014  . Other hammer toe (acquired) 03/19/2014  . Keratoma 03/19/2014  . Pain in lower limb 03/19/2014  . Neuropathy 03/19/2014  . Closed intertrochanteric fracture of left hip (HCC) 07/20/2011  . 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 Left patella fracture 07/19/2011     Kipp Laurence, PT, DPT 12/16/16  5:57 PM   [facility] [address]  [address] [place], [place], [address] Phone: [phone]   Fax:  [phone]  Name: Keary Waterson. MRN: [id number] Date of Birth: 04-05-1951

## 2016-12-16 NOTE — ED Notes (Signed)
ED Provider at bedside. 

## 2016-12-16 NOTE — ED Triage Notes (Signed)
Patient has sutures noted to his left eyebrow. Needs them removed. He also has staples to the back of the head

## 2016-12-16 NOTE — ED Provider Notes (Signed)
[facility] Provider Note   CSN: [id number] Arrival date & time: 12/16/16  1801  By signing my name below, I, [name], attest that this documentation has been prepared under the direction and in the presence of [name], PA-C. Electronically Signed: [name], Scribe. 12/16/2016. 6:41 PM.  History   Chief Complaint Chief Complaint  Patient presents with  . Suture / Staple Removal   HPI Comments [name]. is a 66 y.o. male with a PMHx significant for a recent mechanical fall with subsequent 2cm laceration above left eyebrow s/p suture repair and 2cm laceration to left scalp s/p staple repair, both on  12/07/2016, x10 days ago, who presents to the Emergency Department for suture and staple removal.  Here, he has no complaints.  Denies fevers, chills, nausea, vomiting, headaches, numbness, tingling or active bleeding.   The history is provided by the patient and medical records. No language interpreter was used.    Past Medical History:  Diagnosis Date  . Arthritis   . Closed intertrochanteric fracture of left hip (HCC) 07/20/2011  . Gout   . Left patella fracture 07/19/2011  . 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 SVT (supraventricular tachycardia) (HCC) 01/08/2016  . Hammer toe of right foot 04/20/2014  . Other hammer toe (acquired) 03/19/2014  . Keratoma 03/19/2014  . Pain in lower limb 03/19/2014  . Neuropathy 03/19/2014  . Closed intertrochanteric fracture of left hip (HCC) 07/20/2011  . 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 Left patella fracture 07/19/2011    Past Surgical History:  Procedure Laterality Date  . COMPRESSION HIP SCREW  07/20/2011   Procedure: COMPRESSION HIP;  Surgeon: Eulas Post, MD;  Location: [facility] OR;  Service: Orthopedics;  Laterality: Left;  . ELECTROPHYSIOLOGIC STUDY N/A 01/08/2016   Procedure: /SVT Ablation;  Surgeon: Marinus Maw, MD;  Location: [facility]  INVASIVE CV LAB;  Service: Cardiovascular;  Laterality: N/A;  . KNEE SURGERY    . ROUX-EN-Y PROCEDURE  2008   carpal tunnell  l rcr       Home Medications    Prior to Admission medications   Medication Sig Start Date End Date Taking? Authorizing Provider  gabapentin (NEURONTIN) 300 MG capsule Take 600 mg by mouth 2 (two) times daily.    [provider]  HYDROcodone-acetaminophen (NORCO/VICODIN) 5-325 MG tablet Take 1 tablet by mouth every 6 (six) hours as needed for severe pain. 12/07/16   [name], [name], PA-C  predniSONE (DELTASONE) 10 MG tablet Take 10 mg by mouth daily. 02/04/16   [provider]  traMADol (ULTRAM) 50 MG tablet Take 1 tablet (50 mg total) by mouth every 6 (six) hours as needed. For pain 07/05/13   [name], DPM    Family History Family History  Problem Relation Age of Onset  . Heart disease Father        Died in his 48s of a heart attack    Social History Social History  Substance Use Topics  . Smoking status: Never Smoker  . Smokeless tobacco: Never Used  . Alcohol use Yes     Comment: occasional   Allergies   Patient has no known allergies.  Review of Systems Review of Systems  Constitutional: Negative for chills and fever.  Gastrointestinal: Negative for nausea and vomiting.  Neurological: Negative for numbness and headaches.   Physical Exam Updated Vital Signs BP 131/82 (BP Location: Left  Arm)   Pulse 81   Temp 98.6 F (37 C) (Oral)   Resp 18   Ht 6' (1.829 m)   Wt 213 lb (96.6 kg)   SpO2 100%   BMI 28.89 kg/m   Physical Exam  Constitutional: He is oriented to person, place, and time. He appears well-developed and well-nourished.  HENT:  Healing laceration to the left eyebrow with 4 stitches intact. Healing well with no wound dehiscence, drainage, erythema. Laceration measuring approximately 4 cm. 3 staples in the left parietal scalp. With the wound healing well as well. Some scabbing noted, otherwise no  drainage, no wound dehiscence, no tenderness, no erythema.  Eyes: Conjunctivae are normal.  Neck: Normal range of motion.  Pulmonary/Chest: Effort normal.  Musculoskeletal: Normal range of motion.  Neurological: He is alert and oriented to person, place, and time.  Skin: Skin is warm and dry.  Nursing note and vitals reviewed.  ED Treatments / Results  DIAGNOSTIC STUDIES: Oxygen Saturation is 100% on room air, normal by my interpretation.    COORDINATION OF CARE: 6:19 PM Discussed treatment plan with pt at bedside and pt agreed to plan.  Labs (all labs ordered are listed, but only abnormal results are displayed) Labs Reviewed - No data to display  EKG  EKG Interpretation None       Radiology No results found.  Procedures .Suture Removal Date/Time: 12/16/2016 6:26 PM Performed by: Jaynie Crumble Authorized by: Jaynie Crumble   Consent:    Consent obtained:  Verbal   Consent given by:  Patient   Risks discussed:  Bleeding, pain and wound separation   Alternatives discussed:  No treatment, delayed treatment, alternative treatment, observation and referral Location:    Location:  Head/neck   Head/neck location:  Scalp (L temporal) Procedure details:    Wound appearance:  No signs of infection   Number of staples removed:  3 Post-procedure details:    Post-removal:  Antibiotic ointment applied   Patient tolerance of procedure:  Tolerated well, no immediate complications .Suture Removal Date/Time: 12/16/2016 6:28 PM Performed by: Jaynie Crumble Authorized by: Jaynie Crumble   Consent:    Consent obtained:  Verbal   Consent given by:  Patient   Risks discussed:  Bleeding, pain and wound separation   Alternatives discussed:  No treatment, delayed treatment, alternative treatment, observation and referral Location:    Location:  Head/neck   Head/neck location:  Forehead (Left eyebrow) Procedure details:    Wound appearance:  No signs of  infection   Number of sutures removed:  4 Post-procedure details:    Post-removal:  Antibiotic ointment applied   Patient tolerance of procedure:  Tolerated well, no immediate complications   (including critical care time)  Medications Ordered in ED Medications - No data to display   Initial Impression / Assessment and Plan / ED Course  I have reviewed the triage vital signs and the nursing notes.  Pertinent labs & imaging results that were available during my care of the patient were reviewed by me and considered in my medical decision making (see chart for details).     Patient emergency department for suture and staple removal after a fall 10 days ago. Wounds are healing well. No evidence of wound infection or dehiscence. Staples and sutures removed. Continue wound care at home. Follow-up as needed.  Vitals:   12/16/16 1810  BP: 131/82  Pulse: 81  Resp: 18  Temp: 98.6 F (37 C)  TempSrc: Oral  SpO2: 100%  Weight:  96.6 kg (213 lb)  Height: 6' (1.829 m)     Final Clinical Impressions(s) / ED Diagnoses   Final diagnoses:  Visit for suture removal  Removal of staples   I personally performed the services described in this documentation, which was scribed in my presence. The recorded information has been reviewed and is accurate.   New Prescriptions Discharge Medication List as of 12/16/2016  6:29 PM       Jaynie Crumble, PA-C 12/17/16 0112    Alvira Monday, MD 12/17/16 1310

## 2016-12-16 NOTE — Discharge Instructions (Signed)
Continue to keep your wounds clean. Apply bacitracin twice a day. Follow up as needed

## 2016-12-21 ENCOUNTER — Ambulatory Visit: Payer: Medicare Other

## 2016-12-23 ENCOUNTER — Ambulatory Visit: Payer: Medicare Other | Admitting: Physical Therapy

## 2016-12-23 DIAGNOSIS — R262 Difficulty in walking, not elsewhere classified: Secondary | ICD-10-CM

## 2016-12-23 DIAGNOSIS — M25551 Pain in right hip: Secondary | ICD-10-CM | POA: Diagnosis not present

## 2016-12-23 DIAGNOSIS — R2689 Other abnormalities of gait and mobility: Secondary | ICD-10-CM

## 2016-12-23 NOTE — Therapy (Signed)
[facility] [address]  [address] [place], [place], [address] Phone: [phone]   Fax:  [phone]  Physical Therapy Treatment  Patient Details  Name: [name]. MRN: [id number] Date of Birth: 07-23-1950 Referring Provider: Dr. Forest Becker  Encounter Date: 12/23/2016      PT End of Session - 12/23/16 1830    Visit Number 8   Number of Visits 16   Date for PT Re-Evaluation 01/27/17   Authorization Type Medicare   PT Start Time 1703   PT Stop Time 1744   PT Time Calculation (min) 41 min   Activity Tolerance Patient tolerated treatment well   Behavior During Therapy [facility] for tasks assessed/performed      Past Medical History:  Diagnosis Date  . Arthritis   . Closed intertrochanteric fracture of left hip (HCC) 07/20/2011  . Gout   . Left patella fracture 07/19/2011  . S/P gastric bypass 07/19/2011    Past Surgical History:  Procedure Laterality Date  . COMPRESSION HIP SCREW  07/20/2011   Procedure: COMPRESSION HIP;  Surgeon: Eulas Post, MD;  Location: [facility] OR;  Service: Orthopedics;  Laterality: Left;  . ELECTROPHYSIOLOGIC STUDY N/A 01/08/2016   Procedure: /SVT Ablation;  Surgeon: Marinus Maw, MD;  Location: [facility] INVASIVE CV LAB;  Service: Cardiovascular;  Laterality: N/A;  . KNEE SURGERY    . ROUX-EN-Y PROCEDURE  2008   carpal tunnell  l rcr    There were no vitals filed for this visit.      Subjective Assessment - 12/23/16 1826    Subjective patient reporting scaphoid fracture in L UE. no other complaints   Patient Stated Goals get back to fishing   Currently in Pain? No/denies   Pain Score 0-No pain                         [facility] Adult PT Treatment/Exercise - 12/23/16 0001      Knee/Hip Exercises: Aerobic   Recumbent Bike L2 x 6 minutes     Knee/Hip Exercises: Machines for Strengthening   Cybex Knee Extension 25# B con/R ecc x 15   Cybex Knee Flexion 25# B con/R ecc x 15      Knee/Hip Exercises: Standing   SLS R SLS on foam - pebble taps with L LE      Knee/Hip Exercises: Seated   Sit to Sand 15 reps;with UE support  standing on AirEx     Knee/Hip Exercises: Supine   Single Leg Bridge Right;15 reps   Straight Leg Raises Right;15 reps   Straight Leg Raises Limitations 3#   Straight Leg Raise with External Rotation Right;15 reps   Straight Leg Raise with External Rotation Limitations no weight                     PT Long Term Goals - 12/16/16 1654      PT LONG TERM GOAL #1   Title patient to be independent with advanced HEP (12/16/16)   Status On-going     PT LONG TERM GOAL #2   Title Patient to demonstrate proper gait mechaincs with good heel toe gait pattern with no evidence of instability (12/16/16)   Status On-going  slight antalgic gait pattern     PT LONG TERM GOAL #3   Title Patient to improve R hip strength to >/= 4+/5 (12/16/16)   Status Achieved     PT  LONG TERM GOAL #4   Title Patient to report ability to safely enter/exit fishing boat without instability (12/16/16)   Status On-going  due to fear from recent fall     PT LONG TERM GOAL #5   Title Patient to demonstrate ability to ascend/descend 1 flight of steps with no evidence of instability   Status New               Plan - 12/23/16 1830    Clinical Impression Statement Patient well today. Able to progress strengthening well with resistance added to SLR as well as addition of single leg bridge. Patient very uncomfortable with SL stance on foam requiring heavy UE support - will continue to progress this area to improve confidence and ability to complete tasks to reduce fall risk.    PT Treatment/Interventions ADLs/Self Care Home Management;Cryotherapy;Electrical Stimulation;Moist Heat;Ultrasound;Neuromuscular re-education;Balance training;Therapeutic exercise;Therapeutic activities;Functional mobility training;Stair training;Gait training;Patient/family  education;Manual techniques;Passive range of motion;Vasopneumatic Device;Taping;Dry needling   Consulted and Agree with Plan of Care Patient      Patient will benefit from skilled therapeutic intervention in order to improve the following deficits and impairments:  Abnormal gait, Decreased activity tolerance, Decreased balance, Decreased mobility, Decreased strength, Difficulty walking, Pain  Visit Diagnosis: Pain in right hip  Difficulty in walking, not elsewhere classified  Other abnormalities of gait and mobility     Problem List Patient Active Problem List   Diagnosis Date Noted  . SVT (supraventricular tachycardia) (HCC) 01/08/2016  . Hammer toe of right foot 04/20/2014  . Other hammer toe (acquired) 03/19/2014  . Keratoma 03/19/2014  . Pain in lower limb 03/19/2014  . Neuropathy 03/19/2014  . Closed intertrochanteric fracture of left hip (HCC) 07/20/2011  . 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 Left patella fracture 07/19/2011     Kipp Laurence, PT, DPT 12/23/16 6:33 PM   [facility] [address]  [address] [place], [place], [address] Phone: [phone]   Fax:  [phone]  Name: [name]. MRN: [id number] Date of Birth: [birth year]

## 2016-12-28 ENCOUNTER — Ambulatory Visit: Payer: Medicare Other | Attending: Surgical

## 2016-12-28 DIAGNOSIS — R2689 Other abnormalities of gait and mobility: Secondary | ICD-10-CM | POA: Diagnosis not present

## 2016-12-28 DIAGNOSIS — M25551 Pain in right hip: Secondary | ICD-10-CM | POA: Diagnosis not present

## 2016-12-28 DIAGNOSIS — R262 Difficulty in walking, not elsewhere classified: Secondary | ICD-10-CM | POA: Insufficient documentation

## 2016-12-28 NOTE — Therapy (Signed)
[facility] Outpatient Rehabilitation [facility] [address]  [address] [place], [place], [address] Phone: [phone]   Fax:  [phone]  Physical Therapy Treatment  Patient Details  Name: [name]. MRN: [id number] Date of Birth: 05/13/1951 Referring Provider: Dr. Forest Becker  Encounter Date: 12/28/2016      PT End of Session - 12/28/16 1710    Visit Number 9   Number of Visits 16   Date for PT Re-Evaluation 01/27/17   Authorization Type Medicare   PT Start Time 1704   PT Stop Time 1752   PT Time Calculation (min) 48 min   Activity Tolerance Patient tolerated treatment well   Behavior During Therapy [facility] for tasks assessed/performed      Past Medical History:  Diagnosis Date  . Arthritis   . Closed intertrochanteric fracture of left hip (HCC) 07/20/2011  . Gout   . Left patella fracture 07/19/2011  . S/P gastric bypass 07/19/2011    Past Surgical History:  Procedure Laterality Date  . COMPRESSION HIP SCREW  07/20/2011   Procedure: COMPRESSION HIP;  Surgeon: Eulas Post, MD;  Location: [facility] OR;  Service: Orthopedics;  Laterality: Left;  . ELECTROPHYSIOLOGIC STUDY N/A 01/08/2016   Procedure: /SVT Ablation;  Surgeon: Marinus Maw, MD;  Location: [facility] INVASIVE CV LAB;  Service: Cardiovascular;  Laterality: N/A;  . KNEE SURGERY    . ROUX-EN-Y PROCEDURE  2008   carpal tunnell  l rcr    There were no vitals filed for this visit.      Subjective Assessment - 12/28/16 1709    Subjective Pt. doing well.  Admitting to poor HEP adherence.     Patient Stated Goals get back to fishing   Currently in Pain? No/denies   Pain Score 0-No pain   Multiple Pain Sites No            [facility] PT Assessment - 12/28/16 1749      Assessment   Next MD Visit 7.11.18                     [facility] Adult PT Treatment/Exercise - 12/28/16 1726      Knee/Hip Exercises: Aerobic   Recumbent Bike L2 x 6 minutes     Knee/Hip Exercises: Machines for  Strengthening   Cybex Knee Extension 35# B con/R ecc x 15 reps   Cybex Knee Flexion 35# B con/R ecc x 15 reps     Knee/Hip Exercises: Standing   Forward Lunges Right;Left;1 set;15 reps   Functional Squat 1 set;15 reps;3 seconds   Functional Squat Limitations at counter only R UE support    SLS R SLS on foam - cone taps with L LE 2 x 45 sec; 1 ski pole    Other Standing Knee Exercises Side stepping with red TB around ankles on blue foam beam x 5 laps down back; 2 ski poloes;  x 2 laps without UE support      Knee/Hip Exercises: Supine   Straight Leg Raises Right;15 reps   Straight Leg Raises Limitations 3#                PT Education - 12/28/16 1837    Education provided Yes   Education Details lunge, squat    Person(s) Educated Patient   Methods Explanation;Demonstration;Verbal cues;Handout   Comprehension Verbalized understanding;Returned demonstration;Verbal cues required;Need further instruction             PT Long Term Goals -  12/16/16 1654      PT LONG TERM GOAL #1   Title patient to be independent with advanced HEP (12/16/16)   Status On-going     PT LONG TERM GOAL #2   Title Patient to demonstrate proper gait mechaincs with good heel toe gait pattern with no evidence of instability (12/16/16)   Status On-going  slight antalgic gait pattern     PT LONG TERM GOAL #3   Title Patient to improve R hip strength to >/= 4+/5 (12/16/16)   Status Achieved     PT LONG TERM GOAL #4   Title Patient to report ability to safely enter/exit fishing boat without instability (12/16/16)   Status On-going  due to fear from recent fall     PT LONG TERM GOAL #5   Title Patient to demonstrate ability to ascend/descend 1 flight of steps with no evidence of instability   Status New               Plan - 12/28/16 1723    Clinical Impression Statement Pt. doing well today.  Focus of today's treatment was standing LE strengthening and proprioception on compliant  surfaces.  Balance on BOSU ball (down), and side stepping on blue foam balance beam without issue.  HEP updated with mini squat and lunge.  Will continue LE strengthening and proprioception training per pt. tolerance in coming visits.   PT Treatment/Interventions ADLs/Self Care Home Management;Cryotherapy;Electrical Stimulation;Moist Heat;Ultrasound;Neuromuscular re-education;Balance training;Therapeutic exercise;Therapeutic activities;Functional mobility training;Stair training;Gait training;Patient/family education;Manual techniques;Passive range of motion;Vasopneumatic Device;Taping;Dry needling   PT Next Visit Plan 10th VISIT G-code; FOTO; Hip strengthening, stretching      Patient will benefit from skilled therapeutic intervention in order to improve the following deficits and impairments:  Abnormal gait, Decreased activity tolerance, Decreased balance, Decreased mobility, Decreased strength, Difficulty walking, Pain  Visit Diagnosis: Pain in right hip  Difficulty in walking, not elsewhere classified  Other abnormalities of gait and mobility     Problem List Patient Active Problem List   Diagnosis Date Noted  . SVT (supraventricular tachycardia) (HCC) 01/08/2016  . Hammer toe of right foot 04/20/2014  . Other hammer toe (acquired) 03/19/2014  . Keratoma 03/19/2014  . Pain in lower limb 03/19/2014  . Neuropathy 03/19/2014  . Closed intertrochanteric fracture of left hip (HCC) 07/20/2011  . 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 Left patella fracture 07/19/2011    Kermit Balo, PTA 12/29/16 2:00 PM  [facility] Outpatient [facility] [address]  [address] [place], [place], [address] Phone: [phone]   Fax:  [phone]  Name: [name]. MRN: [id number] Date of Birth: 08/11/1950

## 2016-12-31 ENCOUNTER — Ambulatory Visit: Payer: Medicare Other | Admitting: Physical Therapy

## 2016-12-31 DIAGNOSIS — R2689 Other abnormalities of gait and mobility: Secondary | ICD-10-CM

## 2016-12-31 DIAGNOSIS — R262 Difficulty in walking, not elsewhere classified: Secondary | ICD-10-CM

## 2016-12-31 DIAGNOSIS — M25551 Pain in right hip: Secondary | ICD-10-CM | POA: Diagnosis not present

## 2016-12-31 NOTE — Therapy (Signed)
[facility] [address]  [address] [place], [place], [address] Phone: [phone]   Fax:  [phone]  Physical Therapy Treatment  Patient Details  Name: [name]. MRN: [id number] Date of Birth: 26-Jan-1951 Referring Provider: Dr. Forest Becker  Encounter Date: 12/31/2016      PT End of Session - 12/31/16 1704    Visit Number 10   Number of Visits 16   Date for PT Re-Evaluation 01/27/17   Authorization Type Medicare   PT Start Time 1701   PT Stop Time 1740   PT Time Calculation (min) 39 min   Activity Tolerance Patient tolerated treatment well   Behavior During Therapy [facility] for tasks assessed/performed      Past Medical History:  Diagnosis Date  . Arthritis   . Closed intertrochanteric fracture of left hip (HCC) 07/20/2011  . Gout   . Left patella fracture 07/19/2011  . S/P gastric bypass 07/19/2011    Past Surgical History:  Procedure Laterality Date  . COMPRESSION HIP SCREW  07/20/2011   Procedure: COMPRESSION HIP;  Surgeon: Eulas Post, MD;  Location: [facility] OR;  Service: Orthopedics;  Laterality: Left;  . ELECTROPHYSIOLOGIC STUDY N/A 01/08/2016   Procedure: /SVT Ablation;  Surgeon: Marinus Maw, MD;  Location: [facility] INVASIVE CV LAB;  Service: Cardiovascular;  Laterality: N/A;  . KNEE SURGERY    . ROUX-EN-Y PROCEDURE  2008   carpal tunnell  l rcr    There were no vitals filed for this visit.      Subjective Assessment - 12/31/16 1703    Subjective patient well today - no new complaints   Patient Stated Goals get back to fishing   Currently in Pain? No/denies   Pain Score 0-No pain                         [facility] Adult PT Treatment/Exercise - 12/31/16 1706      Knee/Hip Exercises: Aerobic   Recumbent Bike L3 x 6 minutes      Knee/Hip Exercises: Machines for Strengthening   Cybex Knee Extension 45# B LE x 15;    Cybex Knee Flexion 45# B LE x 15;     Knee/Hip Exercises: Standing    Functional Squat 15 reps   Functional Squat Limitations TRX   SLS R SLS on BOSU with cone taps x 15   Other Standing Knee Exercises Step fwd/bwd on AirEx with R LE x 15 reps   Other Standing Knee Exercises weight shifting fwd/bwd x 15; lateral x 15                     PT Long Term Goals - 12/16/16 1654      PT LONG TERM GOAL #1   Title patient to be independent with advanced HEP (12/16/16)   Status On-going     PT LONG TERM GOAL #2   Title Patient to demonstrate proper gait mechaincs with good heel toe gait pattern with no evidence of instability (12/16/16)   Status On-going  slight antalgic gait pattern     PT LONG TERM GOAL #3   Title Patient to improve R hip strength to >/= 4+/5 (12/16/16)   Status Achieved     PT LONG TERM GOAL #4   Title Patient to report ability to safely enter/exit fishing boat without instability (12/16/16)   Status On-going  due to fear from recent fall  PT LONG TERM GOAL #5   Title Patient to demonstrate ability to ascend/descend 1 flight of steps with no evidence of instability   Status New               Plan - 01-02-2017 1705    Clinical Impression Statement Patient doing well today - no complaints at R hip. PT session today focusing on strengthening and balance activities with patient able to perform all activities with no issue. Patient planning on boating this weekend with heavy education for safety with good carryover.    PT Treatment/Interventions ADLs/Self Care Home Management;Cryotherapy;Electrical Stimulation;Moist Heat;Ultrasound;Neuromuscular re-education;Balance training;Therapeutic exercise;Therapeutic activities;Functional mobility training;Stair training;Gait training;Patient/family education;Manual techniques;Passive range of motion;Vasopneumatic Device;Taping;Dry needling   Consulted and Agree with Plan of Care Patient      Patient will benefit from skilled therapeutic intervention in order to improve the  following deficits and impairments:  Abnormal gait, Decreased activity tolerance, Decreased balance, Decreased mobility, Decreased strength, Difficulty walking, Pain  Visit Diagnosis: Pain in right hip  Difficulty in walking, not elsewhere classified  Other abnormalities of gait and mobility       G-Codes - 01-02-17 1705    Functional Assessment Tool Used (Outpatient Only) FOTO: 68 (32% limited)   Functional Limitation Mobility: Walking and moving around   Mobility: Walking and Moving Around Current Status [phone]) At least 20 percent but less than 40 percent impaired, limited or restricted   Mobility: Walking and Moving Around Goal Status [phone]) At least 20 percent but less than 40 percent impaired, limited or restricted      Problem List Patient Active Problem List   Diagnosis Date Noted  . SVT (supraventricular tachycardia) (HCC) 01/08/2016  . Hammer toe of right foot 04/20/2014  . Other hammer toe (acquired) 03/19/2014  . Keratoma 03/19/2014  . Pain in lower limb 03/19/2014  . Neuropathy 03/19/2014  . Closed intertrochanteric fracture of left hip (HCC) 07/20/2011  . 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 Left patella fracture 07/19/2011     Kipp Laurence, PT, DPT January 02, 2017 5:51 PM   [facility] [address]  [address] [place], [place], [address] Phone: [phone]   Fax:  [phone]  Name: Dai Mcadams. MRN: [id number] Date of Birth: Dec 05, 1950

## 2017-01-04 ENCOUNTER — Ambulatory Visit: Payer: Medicare Other

## 2017-01-04 DIAGNOSIS — M1990 Unspecified osteoarthritis, unspecified site: Secondary | ICD-10-CM | POA: Diagnosis not present

## 2017-01-04 DIAGNOSIS — M7582 Other shoulder lesions, left shoulder: Secondary | ICD-10-CM | POA: Diagnosis not present

## 2017-01-04 DIAGNOSIS — M19042 Primary osteoarthritis, left hand: Secondary | ICD-10-CM | POA: Diagnosis not present

## 2017-01-04 DIAGNOSIS — M19041 Primary osteoarthritis, right hand: Secondary | ICD-10-CM | POA: Diagnosis not present

## 2017-01-04 DIAGNOSIS — M79643 Pain in unspecified hand: Secondary | ICD-10-CM | POA: Diagnosis not present

## 2017-01-04 DIAGNOSIS — M7989 Other specified soft tissue disorders: Secondary | ICD-10-CM | POA: Diagnosis not present

## 2017-01-06 ENCOUNTER — Ambulatory Visit: Payer: Medicare Other

## 2017-01-06 DIAGNOSIS — R262 Difficulty in walking, not elsewhere classified: Secondary | ICD-10-CM | POA: Diagnosis not present

## 2017-01-06 DIAGNOSIS — R2689 Other abnormalities of gait and mobility: Secondary | ICD-10-CM | POA: Diagnosis not present

## 2017-01-06 DIAGNOSIS — M25551 Pain in right hip: Secondary | ICD-10-CM | POA: Diagnosis not present

## 2017-01-06 NOTE — Therapy (Addendum)
[facility] [address]  [place], [place], [address] Phone: [phone]   Fax:  [phone]  Physical Therapy Treatment  Patient Details  Name: [name]. MRN: [id number] Date of Birth: [birth year] Referring Provider: Dr. Dierdre Searles   Encounter Date: 01/06/2017      PT End of Session - 01/06/17 1714    Visit Number 11   Number of Visits 16   Date for PT Re-Evaluation 01/27/17   Authorization Type Medicare   PT Start Time 1706   PT Stop Time 1756   PT Time Calculation (min) 50 min   Activity Tolerance Patient tolerated treatment well   Behavior During Therapy [facility] for tasks assessed/performed      Past Medical History:  Diagnosis Date  . Arthritis   . Closed intertrochanteric fracture of left hip ([place]) 07/20/2011  . Gout   . Left patella fracture 07/19/2011  . S/P gastric bypass 07/19/2011    Past Surgical History:  Procedure Laterality Date  . COMPRESSION HIP SCREW  07/20/2011   Procedure: COMPRESSION HIP;  Surgeon: Johnny Bridge, MD;  Location: [place];  Service: Orthopedics;  Laterality: Left;  . ELECTROPHYSIOLOGIC STUDY N/A 01/08/2016   Procedure: /SVT Ablation;  Surgeon: Evans Lance, MD;  Location: [place] CV LAB;  Service: Cardiovascular;  Laterality: N/A;  . KNEE SURGERY    . ROUX-EN-Y PROCEDURE  2008   carpal tunnell  l rcr    There were no vitals filed for this visit.      Subjective Assessment - 01/06/17 1710    Subjective patient well today.  Seen initially with slight limp stating, "i'm sore after working all day."     Patient Stated Goals get back to fishing   Currently in Pain? No/denies   Pain Score 0-No pain   Multiple Pain Sites No            [facility] PT Assessment - 01/06/17 1725      Assessment   Medical Diagnosis R hip fracture s/p IM nailing   Referring Provider Dr. Dierdre Searles    Next MD Visit 7.12.18     Strength   Right/Left Hip Right   Right  Hip Flexion 4/5   Right Hip ABduction 4+/5   Right Hip ADduction 5/5   Right/Left Knee Right;Left   Right Knee Flexion 4+/5   Right Knee Extension 4+/5   Left Knee Flexion 5/5   Left Knee Extension 5/5                     [facility] Adult PT Treatment/Exercise - 01/06/17 1729      Ambulation/Gait   Ambulation/Gait Yes   Ambulation/Gait Assistance 7: Independent   Ambulation Distance (Feet) 400 Feet   Assistive device None   Gait Pattern Decreased hip/knee flexion - right;Decreased stride length;Decreased step length - left;Decreased step length - right   Stairs Yes   Stairs Assistance 6: Modified independent (Device/Increase time)   Stair Management Technique One rail Right;Alternating pattern   Number of Stairs 14   Height of Stairs 8   Gait Comments less use of UE to ascend stairs; still some vaulting; still with some decreased quad control at R on descending     Knee/Hip Exercises: Aerobic   Nustep L7 x 6 minutes     Knee/Hip Exercises: Standing   Forward Lunges Right;Left;1 set;15 reps   Functional Squat 20 reps   Functional Squat Limitations  TRX   Other Standing Knee Exercises double leg stance on BOSU ball (down) x 2 min      Knee/Hip Exercises: Supine   Single Leg Bridge 15 reps;Left  with R SLR   Straight Leg Raises Right;15 reps   Straight Leg Raises Limitations 3#                     PT Long Term Goals - 01/06/17 1717      PT LONG TERM GOAL #1   Title patient to be independent with advanced HEP (12/16/16)   Status Partially Met  7.11.18: met for current     PT LONG TERM GOAL #2   Title Patient to demonstrate proper gait mechaincs with good heel toe gait pattern with no evidence of instability (12/16/16)   Status Achieved     PT LONG TERM GOAL #3   Title Patient to improve R hip strength to >/= 4+/5 (12/16/16)   Status Partially Met  7.11.18: R hip still 4/5 met for all others     PT LONG TERM GOAL #4   Title Patient to report  ability to safely enter/exit fishing boat without instability (12/16/16)   Status Achieved  7.11.18: reported feels safe with this.       PT LONG TERM GOAL #5   Title Patient to demonstrate ability to ascend/descend 1 flight of steps with no evidence of instability   Status On-going  7.11.18: Pt. still with some instability on R eccentric descending.                 Plan - 01/06/17 1732    Clinical Impression Statement Pt. doing well today.  Pt. partially meeting strength goal today with R hip flexion strength still 4/5.  Pt. able to navigate stairs reciprocally however still with mod rail use and limited R quad control with eccentric descending.  Pt. able to enter/exit boat over weekend and feels he was safe with this now.  Pt. to see MD tomorrow for f/u and wants to wait to consult with MD regarding further need for therapy.   PT Treatment/Interventions ADLs/Self Care Home Management;Cryotherapy;Electrical Stimulation;Moist Heat;Ultrasound;Neuromuscular re-education;Balance training;Therapeutic exercise;Therapeutic activities;Functional mobility training;Stair training;Gait training;Patient/family education;Manual techniques;Passive range of motion;Vasopneumatic Device;Taping;Dry needling   PT Next Visit Plan Hip strengthening, stretching; balance training      Patient will benefit from skilled therapeutic intervention in order to improve the following deficits and impairments:  Abnormal gait, Decreased activity tolerance, Decreased balance, Decreased mobility, Decreased strength, Difficulty walking, Pain  Visit Diagnosis: Pain in right hip  Difficulty in walking, not elsewhere classified  Other abnormalities of gait and mobility  G-Codes    Functional Assessment Tool Used (Outpatient Only) FOTO: 68 (32% limited)   Functional Limitation Mobility: Walking and moving around   Mobility: Walking and Moving Around Goal Status [phone]) At least 20 percent but less than 40 percent  impaired, limited or restricted   Mobility: Walking and Moving Around Discharge Status [phone]) At least 20 percent but less than 40 percent impaired, limited or restricted      Problem List Patient Active Problem List   Diagnosis Date Noted  . SVT (supraventricular tachycardia) ([place]) 01/08/2016  . Hammer toe of right foot 04/20/2014  . Other hammer toe (acquired) 03/19/2014  . Keratoma 03/19/2014  . Pain in lower limb 03/19/2014  . Neuropathy 03/19/2014  . Closed intertrochanteric fracture of left hip ([place]) 07/20/2011  . 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 Left patella fracture 07/19/2011  Bess Harvest, PTA 01/06/17 6:22 PM   PHYSICAL THERAPY DISCHARGE SUMMARY  Visits from Start of Care: 11  Current functional level related to goals / functional outcomes: See above; strength and general balance likely to improve with time. Has made excellent progress since initial injury.    Remaining deficits: See above   Education / Equipment: HEP  Plan: Patient agrees to discharge.  Patient goals were partially met. Patient is being discharged due to being pleased with the current functional level.  ?????    Lanney Gins, PT, DPT 01/25/17 3:23 PM    [facility] [address]  [place], [place], [address] Phone: [phone]   Fax:  [phone]  Name: [name]. MRN: [id number] Date of Birth: 07-19-1950

## 2017-01-07 DIAGNOSIS — S72141D Displaced intertrochanteric fracture of right femur, subsequent encounter for closed fracture with routine healing: Secondary | ICD-10-CM | POA: Diagnosis not present

## 2017-01-07 DIAGNOSIS — M810 Age-related osteoporosis without current pathological fracture: Secondary | ICD-10-CM | POA: Diagnosis not present

## 2017-01-07 DIAGNOSIS — S72001D Fracture of unspecified part of neck of right femur, subsequent encounter for closed fracture with routine healing: Secondary | ICD-10-CM | POA: Diagnosis not present

## 2017-01-21 DIAGNOSIS — M7989 Other specified soft tissue disorders: Secondary | ICD-10-CM | POA: Diagnosis not present

## 2017-01-21 DIAGNOSIS — M7582 Other shoulder lesions, left shoulder: Secondary | ICD-10-CM | POA: Diagnosis not present

## 2017-01-21 DIAGNOSIS — M1990 Unspecified osteoarthritis, unspecified site: Secondary | ICD-10-CM | POA: Diagnosis not present

## 2017-01-21 DIAGNOSIS — M79643 Pain in unspecified hand: Secondary | ICD-10-CM | POA: Diagnosis not present

## 2017-02-16 ENCOUNTER — Ambulatory Visit: Payer: Medicare Other | Admitting: Internal Medicine

## 2017-03-02 DIAGNOSIS — M7989 Other specified soft tissue disorders: Secondary | ICD-10-CM | POA: Diagnosis not present

## 2017-03-02 DIAGNOSIS — M79643 Pain in unspecified hand: Secondary | ICD-10-CM | POA: Diagnosis not present

## 2017-03-02 DIAGNOSIS — M7582 Other shoulder lesions, left shoulder: Secondary | ICD-10-CM | POA: Diagnosis not present

## 2017-03-02 DIAGNOSIS — M79641 Pain in right hand: Secondary | ICD-10-CM | POA: Diagnosis not present

## 2017-03-02 DIAGNOSIS — M1990 Unspecified osteoarthritis, unspecified site: Secondary | ICD-10-CM | POA: Diagnosis not present

## 2017-03-04 DIAGNOSIS — M0579 Rheumatoid arthritis with rheumatoid factor of multiple sites without organ or systems involvement: Secondary | ICD-10-CM | POA: Diagnosis not present

## 2017-04-01 DIAGNOSIS — M1990 Unspecified osteoarthritis, unspecified site: Secondary | ICD-10-CM | POA: Diagnosis not present

## 2017-04-01 DIAGNOSIS — M0579 Rheumatoid arthritis with rheumatoid factor of multiple sites without organ or systems involvement: Secondary | ICD-10-CM | POA: Diagnosis not present

## 2017-04-01 DIAGNOSIS — M7582 Other shoulder lesions, left shoulder: Secondary | ICD-10-CM | POA: Diagnosis not present

## 2017-04-01 DIAGNOSIS — M25512 Pain in left shoulder: Secondary | ICD-10-CM | POA: Diagnosis not present

## 2017-04-01 DIAGNOSIS — M7989 Other specified soft tissue disorders: Secondary | ICD-10-CM | POA: Diagnosis not present

## 2017-04-01 DIAGNOSIS — M79643 Pain in unspecified hand: Secondary | ICD-10-CM | POA: Diagnosis not present

## 2017-04-01 DIAGNOSIS — G629 Polyneuropathy, unspecified: Secondary | ICD-10-CM | POA: Diagnosis not present

## 2017-04-03 DIAGNOSIS — Z23 Encounter for immunization: Secondary | ICD-10-CM | POA: Diagnosis not present

## 2017-05-10 DIAGNOSIS — M25512 Pain in left shoulder: Secondary | ICD-10-CM | POA: Diagnosis not present

## 2017-05-10 DIAGNOSIS — M19011 Primary osteoarthritis, right shoulder: Secondary | ICD-10-CM | POA: Diagnosis not present

## 2017-05-10 DIAGNOSIS — G5622 Lesion of ulnar nerve, left upper limb: Secondary | ICD-10-CM | POA: Diagnosis not present

## 2017-05-10 DIAGNOSIS — M75102 Unspecified rotator cuff tear or rupture of left shoulder, not specified as traumatic: Secondary | ICD-10-CM | POA: Diagnosis not present

## 2017-05-10 DIAGNOSIS — M85811 Other specified disorders of bone density and structure, right shoulder: Secondary | ICD-10-CM | POA: Diagnosis not present

## 2017-05-10 DIAGNOSIS — M12812 Other specific arthropathies, not elsewhere classified, left shoulder: Secondary | ICD-10-CM | POA: Diagnosis not present

## 2017-05-10 DIAGNOSIS — G8929 Other chronic pain: Secondary | ICD-10-CM | POA: Diagnosis not present

## 2017-05-11 DIAGNOSIS — M79643 Pain in unspecified hand: Secondary | ICD-10-CM | POA: Diagnosis not present

## 2017-05-11 DIAGNOSIS — M7582 Other shoulder lesions, left shoulder: Secondary | ICD-10-CM | POA: Diagnosis not present

## 2017-05-11 DIAGNOSIS — G629 Polyneuropathy, unspecified: Secondary | ICD-10-CM | POA: Diagnosis not present

## 2017-05-11 DIAGNOSIS — M0579 Rheumatoid arthritis with rheumatoid factor of multiple sites without organ or systems involvement: Secondary | ICD-10-CM | POA: Diagnosis not present

## 2017-05-11 DIAGNOSIS — M25512 Pain in left shoulder: Secondary | ICD-10-CM | POA: Diagnosis not present

## 2017-05-11 DIAGNOSIS — M7989 Other specified soft tissue disorders: Secondary | ICD-10-CM | POA: Diagnosis not present

## 2017-05-11 DIAGNOSIS — M1990 Unspecified osteoarthritis, unspecified site: Secondary | ICD-10-CM | POA: Diagnosis not present

## 2017-05-17 DIAGNOSIS — M75122 Complete rotator cuff tear or rupture of left shoulder, not specified as traumatic: Secondary | ICD-10-CM | POA: Diagnosis not present

## 2017-06-01 DIAGNOSIS — G5622 Lesion of ulnar nerve, left upper limb: Secondary | ICD-10-CM | POA: Diagnosis not present

## 2017-06-01 DIAGNOSIS — M75102 Unspecified rotator cuff tear or rupture of left shoulder, not specified as traumatic: Secondary | ICD-10-CM | POA: Diagnosis not present

## 2017-06-17 DIAGNOSIS — K219 Gastro-esophageal reflux disease without esophagitis: Secondary | ICD-10-CM | POA: Diagnosis not present

## 2017-06-17 DIAGNOSIS — Z9884 Bariatric surgery status: Secondary | ICD-10-CM | POA: Diagnosis not present

## 2017-06-17 DIAGNOSIS — Z96651 Presence of right artificial knee joint: Secondary | ICD-10-CM | POA: Diagnosis not present

## 2017-06-17 DIAGNOSIS — M069 Rheumatoid arthritis, unspecified: Secondary | ICD-10-CM | POA: Diagnosis not present

## 2017-06-17 DIAGNOSIS — M75102 Unspecified rotator cuff tear or rupture of left shoulder, not specified as traumatic: Secondary | ICD-10-CM | POA: Diagnosis not present

## 2017-06-17 DIAGNOSIS — Z9049 Acquired absence of other specified parts of digestive tract: Secondary | ICD-10-CM | POA: Diagnosis not present

## 2017-06-17 DIAGNOSIS — M1711 Unilateral primary osteoarthritis, right knee: Secondary | ICD-10-CM | POA: Diagnosis not present

## 2017-06-17 DIAGNOSIS — M810 Age-related osteoporosis without current pathological fracture: Secondary | ICD-10-CM | POA: Diagnosis not present

## 2017-06-17 DIAGNOSIS — D509 Iron deficiency anemia, unspecified: Secondary | ICD-10-CM | POA: Diagnosis not present

## 2017-06-28 DIAGNOSIS — N529 Male erectile dysfunction, unspecified: Secondary | ICD-10-CM | POA: Diagnosis not present

## 2017-06-28 DIAGNOSIS — M1990 Unspecified osteoarthritis, unspecified site: Secondary | ICD-10-CM | POA: Diagnosis not present

## 2017-06-28 DIAGNOSIS — M7989 Other specified soft tissue disorders: Secondary | ICD-10-CM | POA: Diagnosis not present

## 2017-06-28 DIAGNOSIS — G629 Polyneuropathy, unspecified: Secondary | ICD-10-CM | POA: Diagnosis not present

## 2017-06-28 DIAGNOSIS — R5383 Other fatigue: Secondary | ICD-10-CM | POA: Diagnosis not present

## 2017-06-28 DIAGNOSIS — R768 Other specified abnormal immunological findings in serum: Secondary | ICD-10-CM | POA: Diagnosis not present

## 2017-06-28 DIAGNOSIS — M7582 Other shoulder lesions, left shoulder: Secondary | ICD-10-CM | POA: Diagnosis not present

## 2017-06-28 DIAGNOSIS — M0579 Rheumatoid arthritis with rheumatoid factor of multiple sites without organ or systems involvement: Secondary | ICD-10-CM | POA: Diagnosis not present

## 2017-06-28 DIAGNOSIS — M25512 Pain in left shoulder: Secondary | ICD-10-CM | POA: Diagnosis not present

## 2017-06-28 DIAGNOSIS — N509 Disorder of male genital organs, unspecified: Secondary | ICD-10-CM | POA: Diagnosis not present

## 2017-06-28 DIAGNOSIS — M069 Rheumatoid arthritis, unspecified: Secondary | ICD-10-CM | POA: Diagnosis not present

## 2017-06-28 DIAGNOSIS — M79643 Pain in unspecified hand: Secondary | ICD-10-CM | POA: Diagnosis not present

## 2017-06-28 DIAGNOSIS — R296 Repeated falls: Secondary | ICD-10-CM | POA: Diagnosis not present

## 2017-07-07 DIAGNOSIS — Z9884 Bariatric surgery status: Secondary | ICD-10-CM | POA: Diagnosis not present

## 2017-07-07 DIAGNOSIS — M8580 Other specified disorders of bone density and structure, unspecified site: Secondary | ICD-10-CM | POA: Diagnosis not present

## 2017-07-07 DIAGNOSIS — M069 Rheumatoid arthritis, unspecified: Secondary | ICD-10-CM | POA: Diagnosis present

## 2017-07-07 DIAGNOSIS — G8929 Other chronic pain: Secondary | ICD-10-CM | POA: Diagnosis not present

## 2017-07-07 DIAGNOSIS — M75122 Complete rotator cuff tear or rupture of left shoulder, not specified as traumatic: Secondary | ICD-10-CM | POA: Diagnosis not present

## 2017-07-07 DIAGNOSIS — M810 Age-related osteoporosis without current pathological fracture: Secondary | ICD-10-CM | POA: Diagnosis present

## 2017-07-07 DIAGNOSIS — M25512 Pain in left shoulder: Secondary | ICD-10-CM | POA: Diagnosis not present

## 2017-07-07 DIAGNOSIS — G8918 Other acute postprocedural pain: Secondary | ICD-10-CM | POA: Diagnosis not present

## 2017-07-07 DIAGNOSIS — D509 Iron deficiency anemia, unspecified: Secondary | ICD-10-CM | POA: Diagnosis present

## 2017-07-07 DIAGNOSIS — Z9049 Acquired absence of other specified parts of digestive tract: Secondary | ICD-10-CM | POA: Diagnosis not present

## 2017-07-07 DIAGNOSIS — M25712 Osteophyte, left shoulder: Secondary | ICD-10-CM | POA: Diagnosis not present

## 2017-07-07 DIAGNOSIS — M12812 Other specific arthropathies, not elsewhere classified, left shoulder: Secondary | ICD-10-CM | POA: Diagnosis not present

## 2017-07-07 DIAGNOSIS — Z96612 Presence of left artificial shoulder joint: Secondary | ICD-10-CM | POA: Diagnosis not present

## 2017-07-07 DIAGNOSIS — M75102 Unspecified rotator cuff tear or rupture of left shoulder, not specified as traumatic: Secondary | ICD-10-CM | POA: Diagnosis present

## 2017-07-07 DIAGNOSIS — K219 Gastro-esophageal reflux disease without esophagitis: Secondary | ICD-10-CM | POA: Diagnosis present

## 2017-07-07 DIAGNOSIS — M1711 Unilateral primary osteoarthritis, right knee: Secondary | ICD-10-CM | POA: Diagnosis present

## 2017-07-07 DIAGNOSIS — M19012 Primary osteoarthritis, left shoulder: Secondary | ICD-10-CM | POA: Diagnosis not present

## 2017-07-07 DIAGNOSIS — Z96651 Presence of right artificial knee joint: Secondary | ICD-10-CM | POA: Diagnosis present

## 2017-07-21 DIAGNOSIS — Z4789 Encounter for other orthopedic aftercare: Secondary | ICD-10-CM | POA: Diagnosis not present

## 2017-07-21 DIAGNOSIS — Z471 Aftercare following joint replacement surgery: Secondary | ICD-10-CM | POA: Diagnosis not present

## 2017-07-21 DIAGNOSIS — Z96612 Presence of left artificial shoulder joint: Secondary | ICD-10-CM | POA: Diagnosis not present

## 2017-07-21 DIAGNOSIS — Z4802 Encounter for removal of sutures: Secondary | ICD-10-CM | POA: Diagnosis not present

## 2017-07-21 DIAGNOSIS — M8580 Other specified disorders of bone density and structure, unspecified site: Secondary | ICD-10-CM | POA: Diagnosis not present

## 2017-07-29 DIAGNOSIS — Z9884 Bariatric surgery status: Secondary | ICD-10-CM | POA: Diagnosis not present

## 2017-08-02 DIAGNOSIS — N4342 Spermatocele of epididymis, multiple: Secondary | ICD-10-CM | POA: Diagnosis not present

## 2017-08-02 DIAGNOSIS — M069 Rheumatoid arthritis, unspecified: Secondary | ICD-10-CM | POA: Diagnosis not present

## 2017-08-02 DIAGNOSIS — N509 Disorder of male genital organs, unspecified: Secondary | ICD-10-CM | POA: Diagnosis not present

## 2017-08-13 DIAGNOSIS — Z131 Encounter for screening for diabetes mellitus: Secondary | ICD-10-CM | POA: Diagnosis not present

## 2017-08-13 DIAGNOSIS — Z0000 Encounter for general adult medical examination without abnormal findings: Secondary | ICD-10-CM | POA: Diagnosis not present

## 2017-08-13 DIAGNOSIS — M0579 Rheumatoid arthritis with rheumatoid factor of multiple sites without organ or systems involvement: Secondary | ICD-10-CM | POA: Diagnosis not present

## 2017-08-13 DIAGNOSIS — Z125 Encounter for screening for malignant neoplasm of prostate: Secondary | ICD-10-CM | POA: Diagnosis not present

## 2017-08-13 DIAGNOSIS — R5383 Other fatigue: Secondary | ICD-10-CM | POA: Diagnosis not present

## 2017-08-13 DIAGNOSIS — G629 Polyneuropathy, unspecified: Secondary | ICD-10-CM | POA: Diagnosis not present

## 2017-08-19 DIAGNOSIS — Z96612 Presence of left artificial shoulder joint: Secondary | ICD-10-CM | POA: Diagnosis not present

## 2017-08-19 DIAGNOSIS — Z471 Aftercare following joint replacement surgery: Secondary | ICD-10-CM | POA: Diagnosis not present

## 2017-09-06 DIAGNOSIS — M7989 Other specified soft tissue disorders: Secondary | ICD-10-CM | POA: Diagnosis not present

## 2017-09-06 DIAGNOSIS — R768 Other specified abnormal immunological findings in serum: Secondary | ICD-10-CM | POA: Diagnosis not present

## 2017-09-06 DIAGNOSIS — M25512 Pain in left shoulder: Secondary | ICD-10-CM | POA: Diagnosis not present

## 2017-09-06 DIAGNOSIS — M1990 Unspecified osteoarthritis, unspecified site: Secondary | ICD-10-CM | POA: Diagnosis not present

## 2017-09-06 DIAGNOSIS — M79643 Pain in unspecified hand: Secondary | ICD-10-CM | POA: Diagnosis not present

## 2017-09-06 DIAGNOSIS — M069 Rheumatoid arthritis, unspecified: Secondary | ICD-10-CM | POA: Diagnosis not present

## 2017-09-06 DIAGNOSIS — M7582 Other shoulder lesions, left shoulder: Secondary | ICD-10-CM | POA: Diagnosis not present

## 2017-09-06 DIAGNOSIS — G629 Polyneuropathy, unspecified: Secondary | ICD-10-CM | POA: Diagnosis not present

## 2017-09-06 DIAGNOSIS — M0579 Rheumatoid arthritis with rheumatoid factor of multiple sites without organ or systems involvement: Secondary | ICD-10-CM | POA: Diagnosis not present

## 2017-09-07 ENCOUNTER — Other Ambulatory Visit: Payer: Self-pay | Admitting: Rheumatology

## 2017-09-07 DIAGNOSIS — M069 Rheumatoid arthritis, unspecified: Secondary | ICD-10-CM

## 2017-09-07 DIAGNOSIS — G629 Polyneuropathy, unspecified: Secondary | ICD-10-CM | POA: Diagnosis not present

## 2017-09-07 DIAGNOSIS — R5383 Other fatigue: Secondary | ICD-10-CM | POA: Diagnosis not present

## 2017-09-07 DIAGNOSIS — N529 Male erectile dysfunction, unspecified: Secondary | ICD-10-CM | POA: Diagnosis not present

## 2017-09-07 DIAGNOSIS — M0579 Rheumatoid arthritis with rheumatoid factor of multiple sites without organ or systems involvement: Secondary | ICD-10-CM | POA: Diagnosis not present

## 2017-09-20 ENCOUNTER — Other Ambulatory Visit: Payer: Self-pay

## 2017-09-22 DIAGNOSIS — M0579 Rheumatoid arthritis with rheumatoid factor of multiple sites without organ or systems involvement: Secondary | ICD-10-CM | POA: Diagnosis not present

## 2017-10-08 DIAGNOSIS — M069 Rheumatoid arthritis, unspecified: Secondary | ICD-10-CM | POA: Diagnosis not present

## 2017-10-22 DIAGNOSIS — M069 Rheumatoid arthritis, unspecified: Secondary | ICD-10-CM | POA: Diagnosis not present

## 2017-11-10 DIAGNOSIS — M069 Rheumatoid arthritis, unspecified: Secondary | ICD-10-CM | POA: Diagnosis not present

## 2017-11-10 DIAGNOSIS — M0579 Rheumatoid arthritis with rheumatoid factor of multiple sites without organ or systems involvement: Secondary | ICD-10-CM | POA: Diagnosis not present

## 2017-11-10 DIAGNOSIS — M79643 Pain in unspecified hand: Secondary | ICD-10-CM | POA: Diagnosis not present

## 2017-11-10 DIAGNOSIS — G629 Polyneuropathy, unspecified: Secondary | ICD-10-CM | POA: Diagnosis not present

## 2017-11-10 DIAGNOSIS — M1990 Unspecified osteoarthritis, unspecified site: Secondary | ICD-10-CM | POA: Diagnosis not present

## 2017-11-10 DIAGNOSIS — R768 Other specified abnormal immunological findings in serum: Secondary | ICD-10-CM | POA: Diagnosis not present

## 2017-11-10 DIAGNOSIS — M7989 Other specified soft tissue disorders: Secondary | ICD-10-CM | POA: Diagnosis not present

## 2017-11-19 DIAGNOSIS — M069 Rheumatoid arthritis, unspecified: Secondary | ICD-10-CM | POA: Diagnosis not present

## 2017-11-19 DIAGNOSIS — M0579 Rheumatoid arthritis with rheumatoid factor of multiple sites without organ or systems involvement: Secondary | ICD-10-CM | POA: Diagnosis not present

## 2017-11-19 DIAGNOSIS — Z79899 Other long term (current) drug therapy: Secondary | ICD-10-CM | POA: Diagnosis not present

## 2017-11-19 DIAGNOSIS — D638 Anemia in other chronic diseases classified elsewhere: Secondary | ICD-10-CM | POA: Diagnosis not present

## 2017-12-13 DIAGNOSIS — M79642 Pain in left hand: Secondary | ICD-10-CM | POA: Diagnosis not present

## 2017-12-13 DIAGNOSIS — M8580 Other specified disorders of bone density and structure, unspecified site: Secondary | ICD-10-CM | POA: Diagnosis not present

## 2017-12-13 DIAGNOSIS — M19042 Primary osteoarthritis, left hand: Secondary | ICD-10-CM | POA: Diagnosis not present

## 2017-12-13 DIAGNOSIS — M19031 Primary osteoarthritis, right wrist: Secondary | ICD-10-CM | POA: Diagnosis not present

## 2017-12-13 DIAGNOSIS — M79641 Pain in right hand: Secondary | ICD-10-CM | POA: Diagnosis not present

## 2017-12-13 DIAGNOSIS — I998 Other disorder of circulatory system: Secondary | ICD-10-CM | POA: Diagnosis not present

## 2017-12-13 DIAGNOSIS — M19032 Primary osteoarthritis, left wrist: Secondary | ICD-10-CM | POA: Diagnosis not present

## 2017-12-13 DIAGNOSIS — M898X4 Other specified disorders of bone, hand: Secondary | ICD-10-CM | POA: Diagnosis not present

## 2017-12-13 DIAGNOSIS — M19041 Primary osteoarthritis, right hand: Secondary | ICD-10-CM | POA: Diagnosis not present

## 2017-12-13 DIAGNOSIS — M180 Bilateral primary osteoarthritis of first carpometacarpal joints: Secondary | ICD-10-CM | POA: Diagnosis not present

## 2017-12-17 DIAGNOSIS — M069 Rheumatoid arthritis, unspecified: Secondary | ICD-10-CM | POA: Diagnosis not present

## 2018-01-04 DIAGNOSIS — M19042 Primary osteoarthritis, left hand: Secondary | ICD-10-CM | POA: Diagnosis not present

## 2018-01-04 DIAGNOSIS — Z981 Arthrodesis status: Secondary | ICD-10-CM | POA: Diagnosis not present

## 2018-01-14 DIAGNOSIS — M069 Rheumatoid arthritis, unspecified: Secondary | ICD-10-CM | POA: Diagnosis not present

## 2018-01-18 DIAGNOSIS — M19042 Primary osteoarthritis, left hand: Secondary | ICD-10-CM | POA: Diagnosis not present

## 2018-01-18 DIAGNOSIS — Z981 Arthrodesis status: Secondary | ICD-10-CM | POA: Diagnosis not present

## 2018-01-18 DIAGNOSIS — Z4802 Encounter for removal of sutures: Secondary | ICD-10-CM | POA: Diagnosis not present

## 2018-01-18 DIAGNOSIS — Z4789 Encounter for other orthopedic aftercare: Secondary | ICD-10-CM | POA: Diagnosis not present

## 2018-02-15 IMAGING — CT CT HEAD W/O CM
4 of 8 series · 16 of 47 positions shown, 18 images · non-contrast
Comparison: None.

CLINICAL DATA: Status post fall.  Laceration to the frontal region.

EXAM:
CT HEAD WITHOUT CONTRAST
CT CERVICAL SPINE WITHOUT CONTRAST
TECHNIQUE: Multidetector CT imaging of the head and cervical spine was
performed following the standard protocol without intravenous
contrast. Multiplanar CT image reconstructions of the cervical spine
were also generated.

[Series 5: head 2.0 h70h · axial · 0.43mm/px · z∈[-121,-9]mm · 6 of 80 slices shown, 8 images]
[im 12/80  brain]
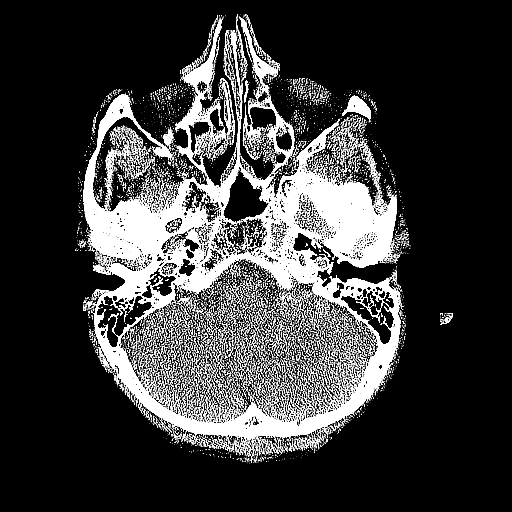
[im 12/80  bone]
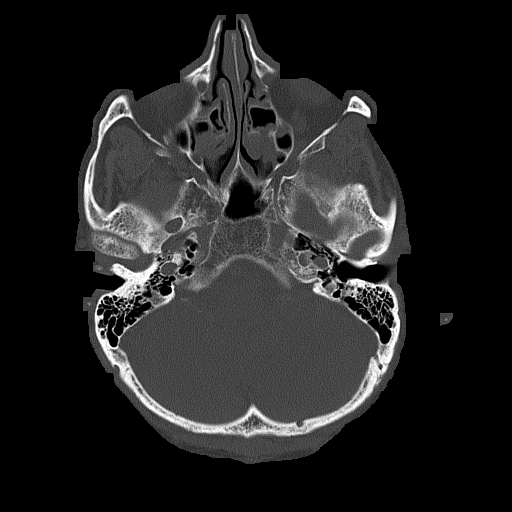
[im 23/80  brain]
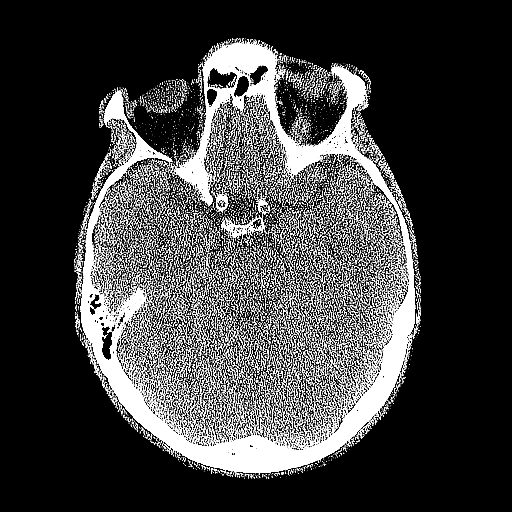
[im 34/80  brain]
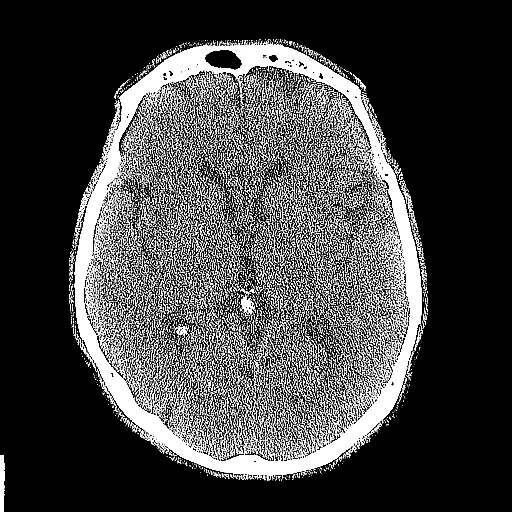
[im 46/80  brain]
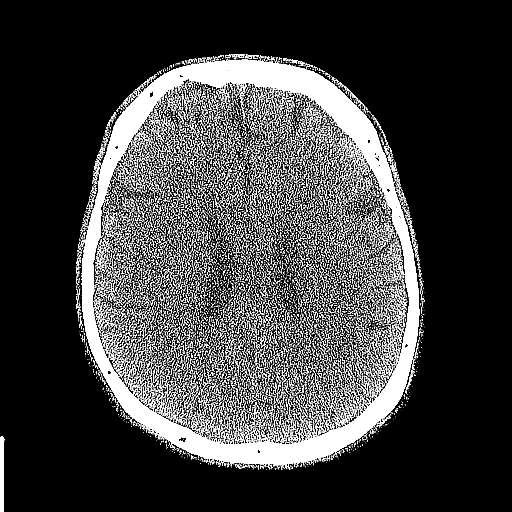
[im 57/80  brain]
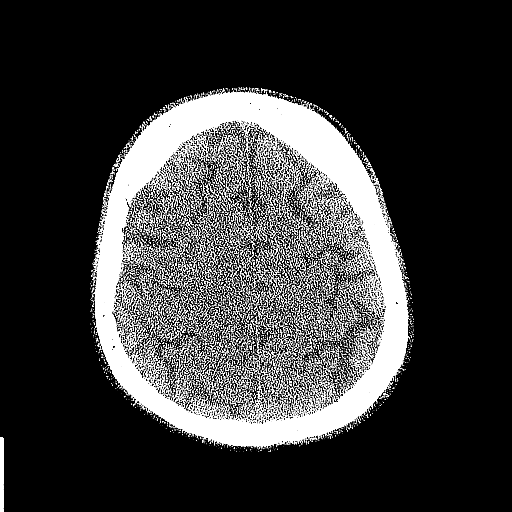
[im 57/80  bone]
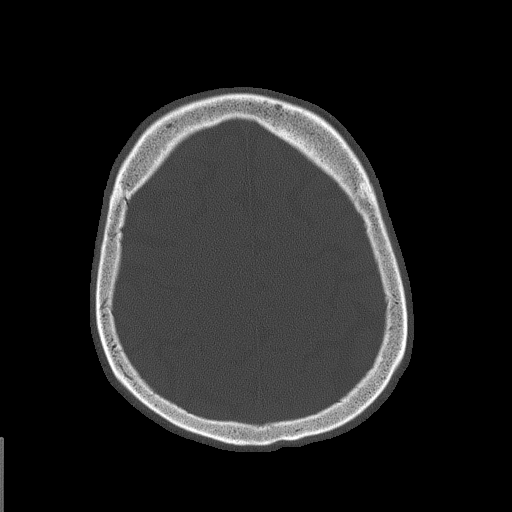
[im 68/80  brain]
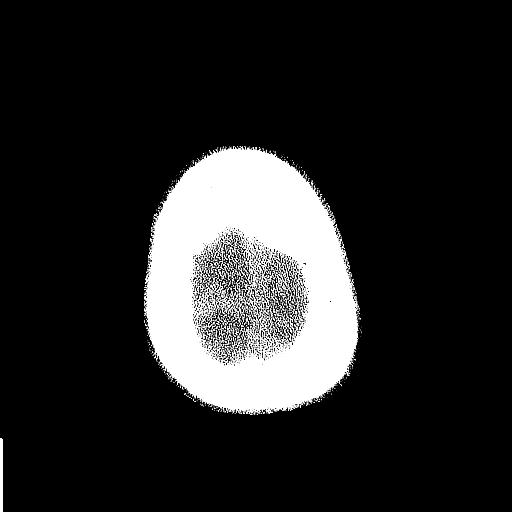

[Series 6: head 3.0 mpr cor · coronal · 0.34mm/px · 3 of 63 slices shown]
[im 18/63  brain]
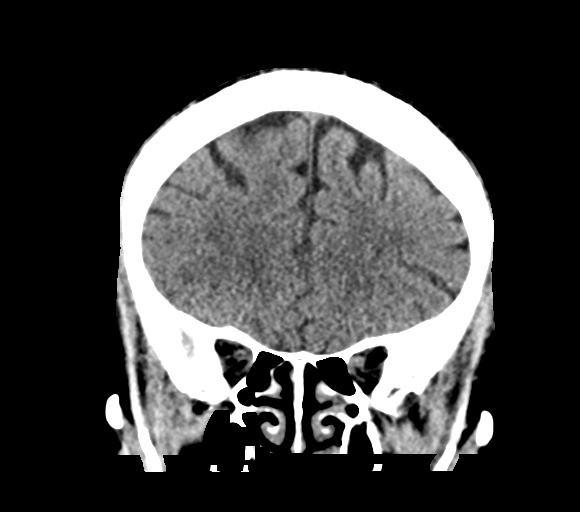
[im 27/63  brain]
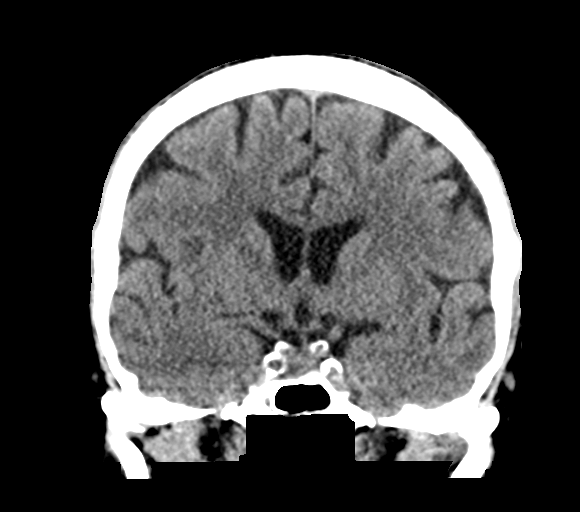
[im 36/63  brain]
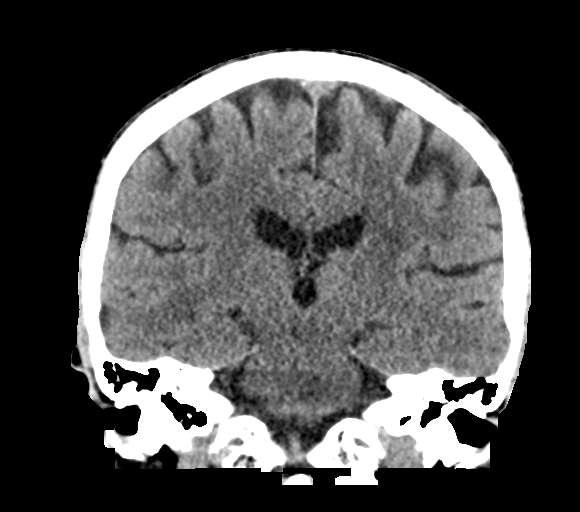

[Series 7: head 3.0 mpr sag · sagittal · 0.33mm/px · 2 of 54 slices shown]
[im 18/54  brain]
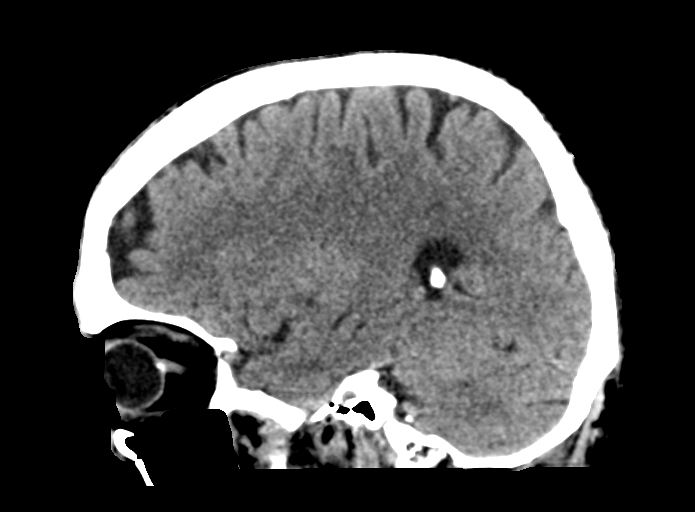
[im 36/54  brain]
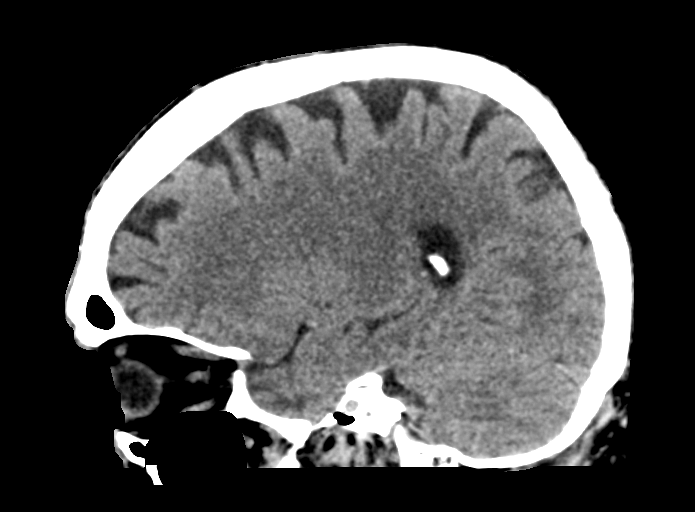

[Series 12: orthogonal axial bone · axial · 0.21mm/px · z∈[-270,-191]mm · 5 of 72 slices shown]
[im 11/72  bone]
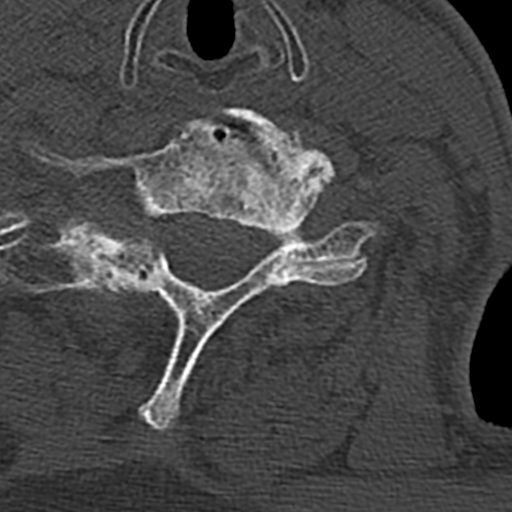
[im 21/72  bone]
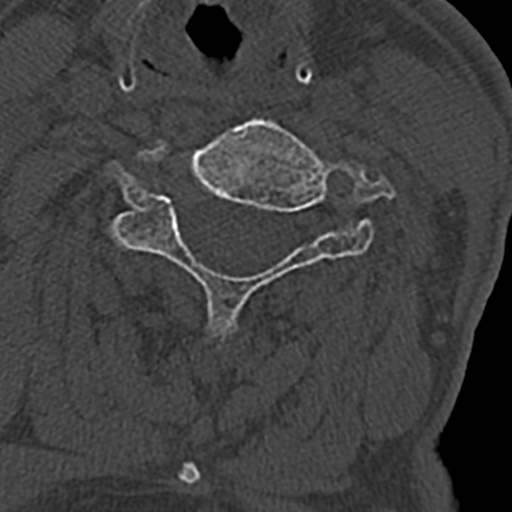
[im 31/72  bone]
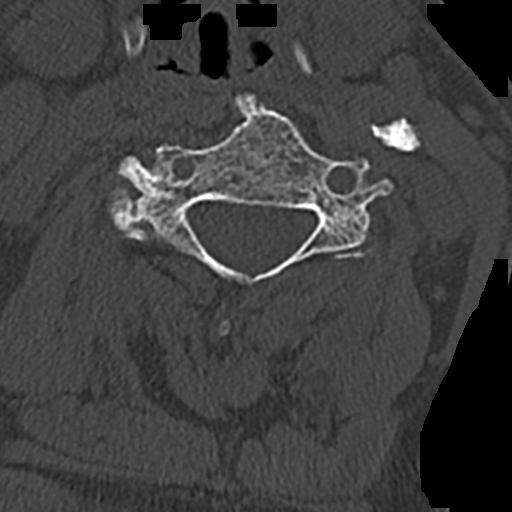
[im 41/72  bone]
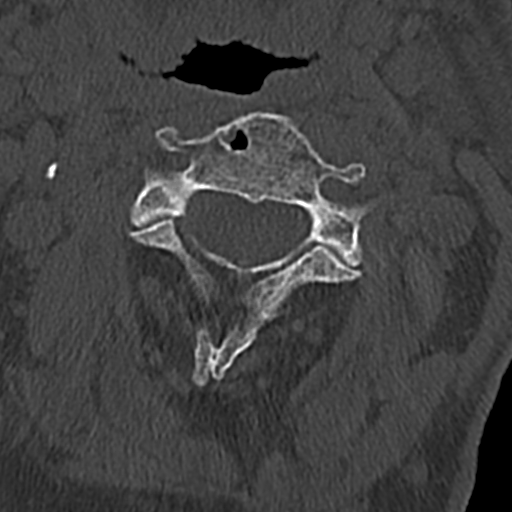
[im 51/72  bone]
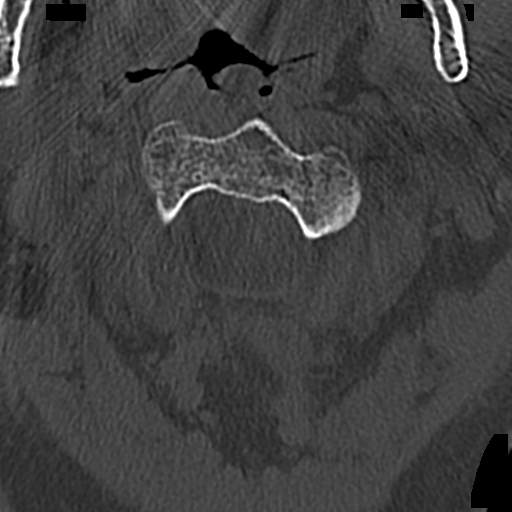

[16 of 47 positions shown; findings below may reference images not displayed]

FINDINGS: CT HEAD FINDINGS

Brain: No evidence of acute infarction, hemorrhage, hydrocephalus,
extra-axial collection or mass lesion/mass effect.

Vascular: No hyperdense vessel or unexpected calcification.

Skull: No osseous abnormality.

Sinuses/Orbits: Left maxillary sinus air-fluid level. Mucosal
thickening in bilateral ethmoid sinuses and bilateral maxillary
sinuses. Mucosal thickening in bilateral frontoethmoidal recesses.
Visualized mastoid sinuses are clear. Visualized orbits demonstrate
no focal abnormality.

Other: None

CT CERVICAL SPINE FINDINGS

Alignment: Normal.

Skull base and vertebrae: No acute fracture. No primary bone lesion
or focal pathologic process.

Soft tissues and spinal canal: No prevertebral fluid or swelling. No
visible canal hematoma.

Disc levels: Degenerative disc disease with disc height loss at
C3-4, C4-5, C6-7 and C7-T1. Osseous fusion of the C5-C6 vertebral
bodies and posterior elements. Broad-based disc bulge at C3-4.
Broad-based disc osteophyte complex at C4-5 with bilateral
uncovertebral degenerative changes and bilateral foraminal
narrowing. Broad-based disc osteophyte complex at C6-7 with
bilateral foraminal narrowing.

Upper chest: Lung apices are clear.

Other: No fluid collection or hematoma.
IMPRESSION: 1. No acute intracranial pathology.
2. No acute osseous injury of the cervical spine.
3. Cervical spine spondylosis as described above.

## 2018-02-23 DIAGNOSIS — M9903 Segmental and somatic dysfunction of lumbar region: Secondary | ICD-10-CM | POA: Diagnosis not present

## 2018-02-23 DIAGNOSIS — M62830 Muscle spasm of back: Secondary | ICD-10-CM | POA: Diagnosis not present

## 2018-02-23 DIAGNOSIS — M9905 Segmental and somatic dysfunction of pelvic region: Secondary | ICD-10-CM | POA: Diagnosis not present

## 2018-02-23 DIAGNOSIS — M9902 Segmental and somatic dysfunction of thoracic region: Secondary | ICD-10-CM | POA: Diagnosis not present

## 2018-02-24 DIAGNOSIS — M75101 Unspecified rotator cuff tear or rupture of right shoulder, not specified as traumatic: Secondary | ICD-10-CM | POA: Diagnosis not present

## 2018-02-24 DIAGNOSIS — M19042 Primary osteoarthritis, left hand: Secondary | ICD-10-CM | POA: Diagnosis not present

## 2018-02-24 DIAGNOSIS — Z981 Arthrodesis status: Secondary | ICD-10-CM | POA: Diagnosis not present

## 2018-02-24 DIAGNOSIS — Z471 Aftercare following joint replacement surgery: Secondary | ICD-10-CM | POA: Diagnosis not present

## 2018-02-24 DIAGNOSIS — Z96612 Presence of left artificial shoulder joint: Secondary | ICD-10-CM | POA: Diagnosis not present

## 2018-02-25 DIAGNOSIS — R5383 Other fatigue: Secondary | ICD-10-CM | POA: Diagnosis not present

## 2018-02-25 DIAGNOSIS — M0579 Rheumatoid arthritis with rheumatoid factor of multiple sites without organ or systems involvement: Secondary | ICD-10-CM | POA: Diagnosis not present

## 2018-02-25 DIAGNOSIS — M069 Rheumatoid arthritis, unspecified: Secondary | ICD-10-CM | POA: Diagnosis not present

## 2018-02-25 DIAGNOSIS — Z79899 Other long term (current) drug therapy: Secondary | ICD-10-CM | POA: Diagnosis not present

## 2018-03-02 DIAGNOSIS — R768 Other specified abnormal immunological findings in serum: Secondary | ICD-10-CM | POA: Diagnosis not present

## 2018-03-02 DIAGNOSIS — M069 Rheumatoid arthritis, unspecified: Secondary | ICD-10-CM | POA: Diagnosis not present

## 2018-03-02 DIAGNOSIS — G629 Polyneuropathy, unspecified: Secondary | ICD-10-CM | POA: Diagnosis not present

## 2018-03-02 DIAGNOSIS — M1990 Unspecified osteoarthritis, unspecified site: Secondary | ICD-10-CM | POA: Diagnosis not present

## 2018-03-02 DIAGNOSIS — M79643 Pain in unspecified hand: Secondary | ICD-10-CM | POA: Diagnosis not present

## 2018-03-02 DIAGNOSIS — M0579 Rheumatoid arthritis with rheumatoid factor of multiple sites without organ or systems involvement: Secondary | ICD-10-CM | POA: Diagnosis not present

## 2018-03-08 DIAGNOSIS — G629 Polyneuropathy, unspecified: Secondary | ICD-10-CM | POA: Diagnosis not present

## 2018-03-08 DIAGNOSIS — R5383 Other fatigue: Secondary | ICD-10-CM | POA: Diagnosis not present

## 2018-03-08 DIAGNOSIS — D638 Anemia in other chronic diseases classified elsewhere: Secondary | ICD-10-CM | POA: Diagnosis not present

## 2018-03-08 DIAGNOSIS — Z79899 Other long term (current) drug therapy: Secondary | ICD-10-CM | POA: Diagnosis not present

## 2018-03-15 DIAGNOSIS — Z23 Encounter for immunization: Secondary | ICD-10-CM | POA: Diagnosis not present

## 2018-03-15 DIAGNOSIS — R5383 Other fatigue: Secondary | ICD-10-CM | POA: Diagnosis not present

## 2018-03-15 DIAGNOSIS — D508 Other iron deficiency anemias: Secondary | ICD-10-CM | POA: Diagnosis not present

## 2018-03-21 DIAGNOSIS — Z1212 Encounter for screening for malignant neoplasm of rectum: Secondary | ICD-10-CM | POA: Diagnosis not present

## 2018-03-23 DIAGNOSIS — E559 Vitamin D deficiency, unspecified: Secondary | ICD-10-CM | POA: Diagnosis not present

## 2018-03-23 DIAGNOSIS — Z9884 Bariatric surgery status: Secondary | ICD-10-CM | POA: Diagnosis not present

## 2018-03-23 DIAGNOSIS — D509 Iron deficiency anemia, unspecified: Secondary | ICD-10-CM | POA: Diagnosis not present

## 2018-03-25 DIAGNOSIS — M069 Rheumatoid arthritis, unspecified: Secondary | ICD-10-CM | POA: Diagnosis not present

## 2018-03-28 DIAGNOSIS — D508 Other iron deficiency anemias: Secondary | ICD-10-CM | POA: Diagnosis not present

## 2018-04-12 DIAGNOSIS — D508 Other iron deficiency anemias: Secondary | ICD-10-CM | POA: Diagnosis not present

## 2018-04-12 DIAGNOSIS — M0579 Rheumatoid arthritis with rheumatoid factor of multiple sites without organ or systems involvement: Secondary | ICD-10-CM | POA: Diagnosis not present

## 2018-04-12 DIAGNOSIS — G629 Polyneuropathy, unspecified: Secondary | ICD-10-CM | POA: Diagnosis not present

## 2018-04-12 DIAGNOSIS — R5383 Other fatigue: Secondary | ICD-10-CM | POA: Diagnosis not present

## 2018-04-22 DIAGNOSIS — D509 Iron deficiency anemia, unspecified: Secondary | ICD-10-CM | POA: Diagnosis not present

## 2018-04-22 DIAGNOSIS — Z9884 Bariatric surgery status: Secondary | ICD-10-CM | POA: Diagnosis not present

## 2018-04-22 DIAGNOSIS — M069 Rheumatoid arthritis, unspecified: Secondary | ICD-10-CM | POA: Diagnosis not present

## 2018-05-03 DIAGNOSIS — R5383 Other fatigue: Secondary | ICD-10-CM | POA: Diagnosis not present

## 2018-05-03 DIAGNOSIS — D508 Other iron deficiency anemias: Secondary | ICD-10-CM | POA: Diagnosis not present

## 2018-05-03 DIAGNOSIS — M0579 Rheumatoid arthritis with rheumatoid factor of multiple sites without organ or systems involvement: Secondary | ICD-10-CM | POA: Diagnosis not present

## 2018-05-03 DIAGNOSIS — G629 Polyneuropathy, unspecified: Secondary | ICD-10-CM | POA: Diagnosis not present

## 2018-05-24 DIAGNOSIS — M069 Rheumatoid arthritis, unspecified: Secondary | ICD-10-CM | POA: Diagnosis not present

## 2018-06-27 DIAGNOSIS — M069 Rheumatoid arthritis, unspecified: Secondary | ICD-10-CM | POA: Diagnosis not present

## 2018-07-04 DIAGNOSIS — M069 Rheumatoid arthritis, unspecified: Secondary | ICD-10-CM | POA: Diagnosis not present

## 2018-07-04 DIAGNOSIS — M1990 Unspecified osteoarthritis, unspecified site: Secondary | ICD-10-CM | POA: Diagnosis not present

## 2018-07-04 DIAGNOSIS — R768 Other specified abnormal immunological findings in serum: Secondary | ICD-10-CM | POA: Diagnosis not present

## 2018-07-04 DIAGNOSIS — M7989 Other specified soft tissue disorders: Secondary | ICD-10-CM | POA: Diagnosis not present

## 2018-07-04 DIAGNOSIS — M0579 Rheumatoid arthritis with rheumatoid factor of multiple sites without organ or systems involvement: Secondary | ICD-10-CM | POA: Diagnosis not present

## 2018-07-04 DIAGNOSIS — G629 Polyneuropathy, unspecified: Secondary | ICD-10-CM | POA: Diagnosis not present

## 2018-07-04 DIAGNOSIS — M79643 Pain in unspecified hand: Secondary | ICD-10-CM | POA: Diagnosis not present

## 2018-07-25 DIAGNOSIS — M069 Rheumatoid arthritis, unspecified: Secondary | ICD-10-CM | POA: Diagnosis not present

## 2018-08-26 DIAGNOSIS — M069 Rheumatoid arthritis, unspecified: Secondary | ICD-10-CM | POA: Diagnosis not present

## 2018-09-27 DIAGNOSIS — D508 Other iron deficiency anemias: Secondary | ICD-10-CM | POA: Diagnosis not present

## 2018-09-27 DIAGNOSIS — R972 Elevated prostate specific antigen [PSA]: Secondary | ICD-10-CM | POA: Diagnosis not present

## 2018-09-27 DIAGNOSIS — G629 Polyneuropathy, unspecified: Secondary | ICD-10-CM | POA: Diagnosis not present

## 2018-09-27 DIAGNOSIS — R5383 Other fatigue: Secondary | ICD-10-CM | POA: Diagnosis not present

## 2018-09-27 DIAGNOSIS — M069 Rheumatoid arthritis, unspecified: Secondary | ICD-10-CM | POA: Diagnosis not present

## 2018-10-25 DIAGNOSIS — M069 Rheumatoid arthritis, unspecified: Secondary | ICD-10-CM | POA: Diagnosis not present

## 2018-10-28 DIAGNOSIS — M25531 Pain in right wrist: Secondary | ICD-10-CM | POA: Diagnosis not present

## 2018-11-22 DIAGNOSIS — M25431 Effusion, right wrist: Secondary | ICD-10-CM | POA: Diagnosis not present

## 2018-11-22 DIAGNOSIS — M7989 Other specified soft tissue disorders: Secondary | ICD-10-CM | POA: Diagnosis not present

## 2018-11-22 DIAGNOSIS — R768 Other specified abnormal immunological findings in serum: Secondary | ICD-10-CM | POA: Diagnosis not present

## 2018-11-22 DIAGNOSIS — M79643 Pain in unspecified hand: Secondary | ICD-10-CM | POA: Diagnosis not present

## 2018-11-22 DIAGNOSIS — M25539 Pain in unspecified wrist: Secondary | ICD-10-CM | POA: Diagnosis not present

## 2018-11-22 DIAGNOSIS — G629 Polyneuropathy, unspecified: Secondary | ICD-10-CM | POA: Diagnosis not present

## 2018-11-22 DIAGNOSIS — M1990 Unspecified osteoarthritis, unspecified site: Secondary | ICD-10-CM | POA: Diagnosis not present

## 2018-11-22 DIAGNOSIS — M069 Rheumatoid arthritis, unspecified: Secondary | ICD-10-CM | POA: Diagnosis not present

## 2018-11-22 DIAGNOSIS — M0579 Rheumatoid arthritis with rheumatoid factor of multiple sites without organ or systems involvement: Secondary | ICD-10-CM | POA: Diagnosis not present

## 2018-12-21 DIAGNOSIS — M069 Rheumatoid arthritis, unspecified: Secondary | ICD-10-CM | POA: Diagnosis not present

## 2019-01-18 DIAGNOSIS — M0579 Rheumatoid arthritis with rheumatoid factor of multiple sites without organ or systems involvement: Secondary | ICD-10-CM | POA: Diagnosis not present

## 2019-01-26 ENCOUNTER — Other Ambulatory Visit: Payer: Self-pay

## 2019-02-15 DIAGNOSIS — M0579 Rheumatoid arthritis with rheumatoid factor of multiple sites without organ or systems involvement: Secondary | ICD-10-CM | POA: Diagnosis not present

## 2019-03-15 DIAGNOSIS — M0579 Rheumatoid arthritis with rheumatoid factor of multiple sites without organ or systems involvement: Secondary | ICD-10-CM | POA: Diagnosis not present

## 2019-03-18 DIAGNOSIS — Z23 Encounter for immunization: Secondary | ICD-10-CM | POA: Diagnosis not present

## 2019-03-27 DIAGNOSIS — M1990 Unspecified osteoarthritis, unspecified site: Secondary | ICD-10-CM | POA: Diagnosis not present

## 2019-03-27 DIAGNOSIS — M0579 Rheumatoid arthritis with rheumatoid factor of multiple sites without organ or systems involvement: Secondary | ICD-10-CM | POA: Diagnosis not present

## 2019-03-27 DIAGNOSIS — M7989 Other specified soft tissue disorders: Secondary | ICD-10-CM | POA: Diagnosis not present

## 2019-03-27 DIAGNOSIS — R768 Other specified abnormal immunological findings in serum: Secondary | ICD-10-CM | POA: Diagnosis not present

## 2019-03-27 DIAGNOSIS — M79643 Pain in unspecified hand: Secondary | ICD-10-CM | POA: Diagnosis not present

## 2019-03-27 DIAGNOSIS — M069 Rheumatoid arthritis, unspecified: Secondary | ICD-10-CM | POA: Diagnosis not present

## 2019-03-27 DIAGNOSIS — G629 Polyneuropathy, unspecified: Secondary | ICD-10-CM | POA: Diagnosis not present

## 2019-04-12 DIAGNOSIS — M0579 Rheumatoid arthritis with rheumatoid factor of multiple sites without organ or systems involvement: Secondary | ICD-10-CM | POA: Diagnosis not present

## 2019-05-10 DIAGNOSIS — M0579 Rheumatoid arthritis with rheumatoid factor of multiple sites without organ or systems involvement: Secondary | ICD-10-CM | POA: Diagnosis not present

## 2019-06-07 DIAGNOSIS — M0579 Rheumatoid arthritis with rheumatoid factor of multiple sites without organ or systems involvement: Secondary | ICD-10-CM | POA: Diagnosis not present

## 2019-07-05 DIAGNOSIS — M0579 Rheumatoid arthritis with rheumatoid factor of multiple sites without organ or systems involvement: Secondary | ICD-10-CM | POA: Diagnosis not present

## 2019-07-27 DIAGNOSIS — R768 Other specified abnormal immunological findings in serum: Secondary | ICD-10-CM | POA: Diagnosis not present

## 2019-07-27 DIAGNOSIS — M7989 Other specified soft tissue disorders: Secondary | ICD-10-CM | POA: Diagnosis not present

## 2019-07-27 DIAGNOSIS — M1990 Unspecified osteoarthritis, unspecified site: Secondary | ICD-10-CM | POA: Diagnosis not present

## 2019-07-27 DIAGNOSIS — M069 Rheumatoid arthritis, unspecified: Secondary | ICD-10-CM | POA: Diagnosis not present

## 2019-07-27 DIAGNOSIS — M79643 Pain in unspecified hand: Secondary | ICD-10-CM | POA: Diagnosis not present

## 2019-07-27 DIAGNOSIS — G629 Polyneuropathy, unspecified: Secondary | ICD-10-CM | POA: Diagnosis not present

## 2019-07-27 DIAGNOSIS — M0579 Rheumatoid arthritis with rheumatoid factor of multiple sites without organ or systems involvement: Secondary | ICD-10-CM | POA: Diagnosis not present

## 2019-08-02 DIAGNOSIS — M0579 Rheumatoid arthritis with rheumatoid factor of multiple sites without organ or systems involvement: Secondary | ICD-10-CM | POA: Diagnosis not present

## 2019-08-31 DIAGNOSIS — M0579 Rheumatoid arthritis with rheumatoid factor of multiple sites without organ or systems involvement: Secondary | ICD-10-CM | POA: Diagnosis not present

## 2019-09-28 DIAGNOSIS — M0579 Rheumatoid arthritis with rheumatoid factor of multiple sites without organ or systems involvement: Secondary | ICD-10-CM | POA: Diagnosis not present

## 2019-10-26 DIAGNOSIS — M0579 Rheumatoid arthritis with rheumatoid factor of multiple sites without organ or systems involvement: Secondary | ICD-10-CM | POA: Diagnosis not present

## 2019-11-24 DIAGNOSIS — M0579 Rheumatoid arthritis with rheumatoid factor of multiple sites without organ or systems involvement: Secondary | ICD-10-CM | POA: Diagnosis not present

## 2020-01-22 DIAGNOSIS — R05 Cough: Secondary | ICD-10-CM | POA: Diagnosis not present

## 2020-01-22 DIAGNOSIS — R0981 Nasal congestion: Secondary | ICD-10-CM | POA: Diagnosis not present

## 2020-01-22 DIAGNOSIS — Z0279 Encounter for issue of other medical certificate: Secondary | ICD-10-CM | POA: Diagnosis not present

## 2020-02-06 DIAGNOSIS — M0579 Rheumatoid arthritis with rheumatoid factor of multiple sites without organ or systems involvement: Secondary | ICD-10-CM | POA: Diagnosis not present

## 2020-02-12 DIAGNOSIS — M0579 Rheumatoid arthritis with rheumatoid factor of multiple sites without organ or systems involvement: Secondary | ICD-10-CM | POA: Diagnosis not present

## 2020-03-18 DIAGNOSIS — M1990 Unspecified osteoarthritis, unspecified site: Secondary | ICD-10-CM | POA: Diagnosis not present

## 2020-03-18 DIAGNOSIS — G629 Polyneuropathy, unspecified: Secondary | ICD-10-CM | POA: Diagnosis not present

## 2020-03-18 DIAGNOSIS — M7989 Other specified soft tissue disorders: Secondary | ICD-10-CM | POA: Diagnosis not present

## 2020-03-18 DIAGNOSIS — M069 Rheumatoid arthritis, unspecified: Secondary | ICD-10-CM | POA: Diagnosis not present

## 2020-03-18 DIAGNOSIS — M79641 Pain in right hand: Secondary | ICD-10-CM | POA: Diagnosis not present

## 2020-03-18 DIAGNOSIS — M79643 Pain in unspecified hand: Secondary | ICD-10-CM | POA: Diagnosis not present

## 2020-03-18 DIAGNOSIS — M79642 Pain in left hand: Secondary | ICD-10-CM | POA: Diagnosis not present

## 2020-03-18 DIAGNOSIS — M0579 Rheumatoid arthritis with rheumatoid factor of multiple sites without organ or systems involvement: Secondary | ICD-10-CM | POA: Diagnosis not present

## 2020-03-18 DIAGNOSIS — R768 Other specified abnormal immunological findings in serum: Secondary | ICD-10-CM | POA: Diagnosis not present

## 2020-04-02 DIAGNOSIS — M0579 Rheumatoid arthritis with rheumatoid factor of multiple sites without organ or systems involvement: Secondary | ICD-10-CM | POA: Diagnosis not present

## 2020-05-17 DIAGNOSIS — Z23 Encounter for immunization: Secondary | ICD-10-CM | POA: Diagnosis not present

## 2020-05-31 DIAGNOSIS — M0579 Rheumatoid arthritis with rheumatoid factor of multiple sites without organ or systems involvement: Secondary | ICD-10-CM | POA: Diagnosis not present

## 2020-07-18 DIAGNOSIS — Z79899 Other long term (current) drug therapy: Secondary | ICD-10-CM | POA: Diagnosis not present

## 2020-07-18 DIAGNOSIS — R768 Other specified abnormal immunological findings in serum: Secondary | ICD-10-CM | POA: Diagnosis not present

## 2020-07-18 DIAGNOSIS — G629 Polyneuropathy, unspecified: Secondary | ICD-10-CM | POA: Diagnosis not present

## 2020-07-18 DIAGNOSIS — M79643 Pain in unspecified hand: Secondary | ICD-10-CM | POA: Diagnosis not present

## 2020-07-18 DIAGNOSIS — M0579 Rheumatoid arthritis with rheumatoid factor of multiple sites without organ or systems involvement: Secondary | ICD-10-CM | POA: Diagnosis not present

## 2020-07-18 DIAGNOSIS — M7989 Other specified soft tissue disorders: Secondary | ICD-10-CM | POA: Diagnosis not present

## 2020-07-18 DIAGNOSIS — M1990 Unspecified osteoarthritis, unspecified site: Secondary | ICD-10-CM | POA: Diagnosis not present

## 2020-07-26 DIAGNOSIS — M0579 Rheumatoid arthritis with rheumatoid factor of multiple sites without organ or systems involvement: Secondary | ICD-10-CM | POA: Diagnosis not present

## 2020-09-20 DIAGNOSIS — M0579 Rheumatoid arthritis with rheumatoid factor of multiple sites without organ or systems involvement: Secondary | ICD-10-CM | POA: Diagnosis not present

## 2020-09-25 DIAGNOSIS — M19042 Primary osteoarthritis, left hand: Secondary | ICD-10-CM | POA: Diagnosis not present

## 2020-09-25 DIAGNOSIS — M19041 Primary osteoarthritis, right hand: Secondary | ICD-10-CM | POA: Diagnosis not present

## 2020-11-15 DIAGNOSIS — M0579 Rheumatoid arthritis with rheumatoid factor of multiple sites without organ or systems involvement: Secondary | ICD-10-CM | POA: Diagnosis not present

## 2020-12-20 DIAGNOSIS — D649 Anemia, unspecified: Secondary | ICD-10-CM | POA: Diagnosis not present

## 2020-12-20 DIAGNOSIS — E7800 Pure hypercholesterolemia, unspecified: Secondary | ICD-10-CM | POA: Diagnosis not present

## 2020-12-20 DIAGNOSIS — R5383 Other fatigue: Secondary | ICD-10-CM | POA: Diagnosis not present

## 2020-12-27 DIAGNOSIS — M0579 Rheumatoid arthritis with rheumatoid factor of multiple sites without organ or systems involvement: Secondary | ICD-10-CM | POA: Diagnosis not present

## 2020-12-27 DIAGNOSIS — N529 Male erectile dysfunction, unspecified: Secondary | ICD-10-CM | POA: Diagnosis not present

## 2020-12-27 DIAGNOSIS — D508 Other iron deficiency anemias: Secondary | ICD-10-CM | POA: Diagnosis not present

## 2020-12-27 DIAGNOSIS — Z0000 Encounter for general adult medical examination without abnormal findings: Secondary | ICD-10-CM | POA: Diagnosis not present

## 2020-12-27 DIAGNOSIS — G629 Polyneuropathy, unspecified: Secondary | ICD-10-CM | POA: Diagnosis not present

## 2021-01-10 DIAGNOSIS — R768 Other specified abnormal immunological findings in serum: Secondary | ICD-10-CM | POA: Diagnosis not present

## 2021-01-10 DIAGNOSIS — M79643 Pain in unspecified hand: Secondary | ICD-10-CM | POA: Diagnosis not present

## 2021-01-10 DIAGNOSIS — M1990 Unspecified osteoarthritis, unspecified site: Secondary | ICD-10-CM | POA: Diagnosis not present

## 2021-01-10 DIAGNOSIS — M0579 Rheumatoid arthritis with rheumatoid factor of multiple sites without organ or systems involvement: Secondary | ICD-10-CM | POA: Diagnosis not present

## 2021-01-10 DIAGNOSIS — G629 Polyneuropathy, unspecified: Secondary | ICD-10-CM | POA: Diagnosis not present

## 2021-01-10 DIAGNOSIS — M1180 Other specified crystal arthropathies, unspecified site: Secondary | ICD-10-CM | POA: Diagnosis not present

## 2021-01-10 DIAGNOSIS — Z79899 Other long term (current) drug therapy: Secondary | ICD-10-CM | POA: Diagnosis not present

## 2021-01-10 DIAGNOSIS — M7989 Other specified soft tissue disorders: Secondary | ICD-10-CM | POA: Diagnosis not present

## 2021-03-13 DIAGNOSIS — M1990 Unspecified osteoarthritis, unspecified site: Secondary | ICD-10-CM | POA: Diagnosis not present

## 2021-03-13 DIAGNOSIS — Z79899 Other long term (current) drug therapy: Secondary | ICD-10-CM | POA: Diagnosis not present

## 2021-03-13 DIAGNOSIS — R768 Other specified abnormal immunological findings in serum: Secondary | ICD-10-CM | POA: Diagnosis not present

## 2021-03-13 DIAGNOSIS — M1180 Other specified crystal arthropathies, unspecified site: Secondary | ICD-10-CM | POA: Diagnosis not present

## 2021-03-13 DIAGNOSIS — M79643 Pain in unspecified hand: Secondary | ICD-10-CM | POA: Diagnosis not present

## 2021-03-13 DIAGNOSIS — M7989 Other specified soft tissue disorders: Secondary | ICD-10-CM | POA: Diagnosis not present

## 2021-03-13 DIAGNOSIS — M0579 Rheumatoid arthritis with rheumatoid factor of multiple sites without organ or systems involvement: Secondary | ICD-10-CM | POA: Diagnosis not present

## 2021-03-13 DIAGNOSIS — G629 Polyneuropathy, unspecified: Secondary | ICD-10-CM | POA: Diagnosis not present

## 2021-05-08 DIAGNOSIS — Z13220 Encounter for screening for lipoid disorders: Secondary | ICD-10-CM | POA: Diagnosis not present

## 2021-05-08 DIAGNOSIS — Z131 Encounter for screening for diabetes mellitus: Secondary | ICD-10-CM | POA: Diagnosis not present

## 2021-05-08 DIAGNOSIS — Z9884 Bariatric surgery status: Secondary | ICD-10-CM | POA: Diagnosis not present

## 2021-05-08 DIAGNOSIS — Z1211 Encounter for screening for malignant neoplasm of colon: Secondary | ICD-10-CM | POA: Diagnosis not present

## 2021-05-08 DIAGNOSIS — M069 Rheumatoid arthritis, unspecified: Secondary | ICD-10-CM | POA: Diagnosis not present

## 2021-05-08 DIAGNOSIS — N442 Benign cyst of testis: Secondary | ICD-10-CM | POA: Diagnosis not present

## 2021-05-08 DIAGNOSIS — Z23 Encounter for immunization: Secondary | ICD-10-CM | POA: Diagnosis not present

## 2021-05-08 DIAGNOSIS — M109 Gout, unspecified: Secondary | ICD-10-CM | POA: Diagnosis not present

## 2021-05-08 DIAGNOSIS — M810 Age-related osteoporosis without current pathological fracture: Secondary | ICD-10-CM | POA: Diagnosis not present

## 2021-05-08 DIAGNOSIS — Z7689 Persons encountering health services in other specified circumstances: Secondary | ICD-10-CM | POA: Diagnosis not present

## 2021-05-09 DIAGNOSIS — D539 Nutritional anemia, unspecified: Secondary | ICD-10-CM | POA: Diagnosis not present

## 2021-05-09 DIAGNOSIS — K9181 Other intraoperative complications of digestive system: Secondary | ICD-10-CM | POA: Diagnosis not present

## 2021-05-09 DIAGNOSIS — Z9884 Bariatric surgery status: Secondary | ICD-10-CM | POA: Diagnosis not present

## 2021-05-09 DIAGNOSIS — D649 Anemia, unspecified: Secondary | ICD-10-CM | POA: Diagnosis not present

## 2021-05-27 DIAGNOSIS — M810 Age-related osteoporosis without current pathological fracture: Secondary | ICD-10-CM | POA: Diagnosis not present

## 2021-06-12 DIAGNOSIS — G629 Polyneuropathy, unspecified: Secondary | ICD-10-CM | POA: Diagnosis not present

## 2021-06-12 DIAGNOSIS — M1990 Unspecified osteoarthritis, unspecified site: Secondary | ICD-10-CM | POA: Diagnosis not present

## 2021-06-12 DIAGNOSIS — M1180 Other specified crystal arthropathies, unspecified site: Secondary | ICD-10-CM | POA: Diagnosis not present

## 2021-06-12 DIAGNOSIS — M79643 Pain in unspecified hand: Secondary | ICD-10-CM | POA: Diagnosis not present

## 2021-06-12 DIAGNOSIS — M7989 Other specified soft tissue disorders: Secondary | ICD-10-CM | POA: Diagnosis not present

## 2021-06-12 DIAGNOSIS — R768 Other specified abnormal immunological findings in serum: Secondary | ICD-10-CM | POA: Diagnosis not present

## 2021-06-12 DIAGNOSIS — M0579 Rheumatoid arthritis with rheumatoid factor of multiple sites without organ or systems involvement: Secondary | ICD-10-CM | POA: Diagnosis not present

## 2021-06-12 DIAGNOSIS — Z79899 Other long term (current) drug therapy: Secondary | ICD-10-CM | POA: Diagnosis not present

## 2021-06-13 DIAGNOSIS — N433 Hydrocele, unspecified: Secondary | ICD-10-CM | POA: Diagnosis not present

## 2021-06-13 DIAGNOSIS — N503 Cyst of epididymis: Secondary | ICD-10-CM | POA: Diagnosis not present

## 2021-06-13 DIAGNOSIS — N5089 Other specified disorders of the male genital organs: Secondary | ICD-10-CM | POA: Diagnosis not present

## 2021-06-13 DIAGNOSIS — I861 Scrotal varices: Secondary | ICD-10-CM | POA: Diagnosis not present

## 2021-06-19 DIAGNOSIS — M7022 Olecranon bursitis, left elbow: Secondary | ICD-10-CM | POA: Diagnosis not present

## 2021-06-19 DIAGNOSIS — M810 Age-related osteoporosis without current pathological fracture: Secondary | ICD-10-CM | POA: Diagnosis not present

## 2021-06-19 DIAGNOSIS — Z9884 Bariatric surgery status: Secondary | ICD-10-CM | POA: Diagnosis not present

## 2021-06-24 LAB — EXTERNAL GENERIC LAB PROCEDURE: COLOGUARD: NEGATIVE

## 2021-06-25 DIAGNOSIS — M810 Age-related osteoporosis without current pathological fracture: Secondary | ICD-10-CM | POA: Diagnosis not present

## 2022-07-05 ENCOUNTER — Emergency Department (HOSPITAL_BASED_OUTPATIENT_CLINIC_OR_DEPARTMENT_OTHER): Payer: Medicare Other

## 2022-07-05 ENCOUNTER — Encounter (HOSPITAL_BASED_OUTPATIENT_CLINIC_OR_DEPARTMENT_OTHER): Payer: Self-pay

## 2022-07-05 ENCOUNTER — Encounter (HOSPITAL_BASED_OUTPATIENT_CLINIC_OR_DEPARTMENT_OTHER): Admission: EM | Disposition: E | Payer: Self-pay | Source: Home / Self Care | Attending: Emergency Medicine

## 2022-07-05 ENCOUNTER — Emergency Department (HOSPITAL_BASED_OUTPATIENT_CLINIC_OR_DEPARTMENT_OTHER)
Admission: EM | Admit: 2022-07-05 | Discharge: 2022-07-30 | Disposition: E | Payer: Medicare Other | Attending: Emergency Medicine | Admitting: Emergency Medicine

## 2022-07-05 ENCOUNTER — Telehealth (HOSPITAL_BASED_OUTPATIENT_CLINIC_OR_DEPARTMENT_OTHER): Payer: Self-pay | Admitting: Emergency Medicine

## 2022-07-05 DIAGNOSIS — I469 Cardiac arrest, cause unspecified: Secondary | ICD-10-CM | POA: Diagnosis not present

## 2022-07-05 DIAGNOSIS — E119 Type 2 diabetes mellitus without complications: Secondary | ICD-10-CM | POA: Insufficient documentation

## 2022-07-05 DIAGNOSIS — R079 Chest pain, unspecified: Secondary | ICD-10-CM

## 2022-07-05 DIAGNOSIS — R4182 Altered mental status, unspecified: Secondary | ICD-10-CM | POA: Diagnosis not present

## 2022-07-05 DIAGNOSIS — I213 ST elevation (STEMI) myocardial infarction of unspecified site: Secondary | ICD-10-CM | POA: Diagnosis not present

## 2022-07-05 DIAGNOSIS — R0602 Shortness of breath: Secondary | ICD-10-CM | POA: Diagnosis present

## 2022-07-05 LAB — COMPREHENSIVE METABOLIC PANEL
ALT: 26 U/L (ref 0–44)
AST: 49 U/L — ABNORMAL HIGH (ref 15–41)
Albumin: 4.2 g/dL (ref 3.5–5.0)
Alkaline Phosphatase: 92 U/L (ref 38–126)
Anion gap: 15 (ref 5–15)
BUN: 16 mg/dL (ref 8–23)
CO2: 18 mmol/L — ABNORMAL LOW (ref 22–32)
Calcium: 8.8 mg/dL — ABNORMAL LOW (ref 8.9–10.3)
Chloride: 101 mmol/L (ref 98–111)
Creatinine, Ser: 1.15 mg/dL (ref 0.61–1.24)
GFR, Estimated: >60 mL/min (ref >60)
Glucose, Bld: 278 mg/dL — ABNORMAL HIGH (ref 70–99)
Potassium: 4.1 mmol/L (ref 3.5–5.1)
Sodium: 134 mmol/L — ABNORMAL LOW (ref 135–145)
Total Bilirubin: 0.4 mg/dL (ref 0.3–1.2)
Total Protein: 7.3 g/dL (ref 6.5–8.1)

## 2022-07-05 LAB — I-STAT VENOUS BLOOD GAS, ED
Acid-base deficit: 10 mmol/L — ABNORMAL HIGH (ref 0.0–2.0)
Bicarbonate: 17.9 mmol/L — ABNORMAL LOW (ref 20.0–28.0)
Calcium, Ion: 1.22 mmol/L (ref 1.15–1.40)
HCT: 42 % (ref 39.0–52.0)
Hemoglobin: 14.3 g/dL (ref 13.0–17.0)
O2 Saturation: 67 %
Potassium: 4.2 mmol/L (ref 3.5–5.1)
Sodium: 137 mmol/L (ref 135–145)
TCO2: 19 mmol/L — ABNORMAL LOW (ref 22–32)
pCO2, Ven: 43.4 mmHg — ABNORMAL LOW (ref 44–60)
pH, Ven: 7.222 — ABNORMAL LOW (ref 7.25–7.43)
pO2, Ven: 41 mmHg (ref 32–45)

## 2022-07-05 LAB — TROPONIN I (HIGH SENSITIVITY): Troponin I (High Sensitivity): 353 ng/L (ref <18)

## 2022-07-05 LAB — APTT: aPTT: 23 seconds — ABNORMAL LOW (ref 24–36)

## 2022-07-05 LAB — PROTIME-INR
INR: 1 (ref 0.8–1.2)
Prothrombin Time: 13.4 seconds (ref 11.4–15.2)

## 2022-07-05 SURGERY — CORONARY/GRAFT ACUTE MI REVASCULARIZATION
Anesthesia: LOCAL

## 2022-07-05 MED ORDER — SODIUM BICARBONATE 8.4 % IV SOLN
INTRAVENOUS | Status: AC | PRN
  Administered 2022-07-05 (×2): 50 meq via INTRAVENOUS

## 2022-07-05 MED ORDER — SODIUM CHLORIDE 0.9 % IV SOLN: INTRAVENOUS | Status: DC

## 2022-07-05 MED ORDER — ETOMIDATE 2 MG/ML IV SOLN
INTRAVENOUS | Status: AC | PRN
  Administered 2022-07-05: 30 mg via INTRAVENOUS

## 2022-07-05 MED ORDER — NOREPINEPHRINE 4 MG/250ML-% IV SOLN: 2.0000 ug/min | INTRAVENOUS | Status: DC

## 2022-07-05 MED ORDER — SODIUM CHLORIDE 0.9 % IV SOLN: 250.0000 mL | INTRAVENOUS | Status: DC | PRN

## 2022-07-05 MED ORDER — EPINEPHRINE 1 MG/10ML IJ SOSY
PREFILLED_SYRINGE | INTRAMUSCULAR | Status: AC | PRN
  Administered 2022-07-05 (×4): 1 mg via INTRAVENOUS

## 2022-07-05 MED ORDER — HEPARIN BOLUS VIA INFUSION: 4000.0000 [IU] | Freq: Once | INTRAVENOUS | Status: DC

## 2022-07-05 MED ORDER — IOHEXOL 350 MG/ML SOLN
100.0000 mL | Freq: Once | INTRAVENOUS | Status: AC | PRN
  Administered 2022-07-05: 100 mL via INTRAVENOUS

## 2022-07-05 MED ORDER — HEPARIN (PORCINE) 25000 UT/250ML-% IV SOLN
INTRAVENOUS | Status: AC
  Filled 2022-07-05: qty 250

## 2022-07-05 MED ORDER — ETOMIDATE 2 MG/ML IV SOLN
INTRAVENOUS | Status: AC
  Filled 2022-07-05: qty 20

## 2022-07-05 MED ORDER — ROCURONIUM BROMIDE 10 MG/ML (PF) SYRINGE
PREFILLED_SYRINGE | INTRAVENOUS | Status: AC
  Filled 2022-07-05: qty 10

## 2022-07-05 MED ORDER — SODIUM CHLORIDE 0.9 % IV SOLN: 250.0000 mL | INTRAVENOUS | Status: DC

## 2022-07-05 MED ORDER — ROCURONIUM BROMIDE 50 MG/5ML IV SOLN
INTRAVENOUS | Status: AC | PRN
  Administered 2022-07-05: 70 mg via INTRAVENOUS

## 2022-07-05 MED ORDER — HEPARIN (PORCINE) 25000 UT/250ML-% IV SOLN: 1300.0000 [IU]/h | INTRAVENOUS | Status: DC

## 2022-07-05 MED ORDER — ASPIRIN 81 MG PO CHEW: 324.0000 mg | CHEWABLE_TABLET | ORAL | Status: DC

## 2022-07-05 MED ORDER — EPINEPHRINE 1 MG/10ML IJ SOSY
PREFILLED_SYRINGE | INTRAMUSCULAR | Status: AC | PRN
  Administered 2022-07-05 (×3): 1 mg via INTRAVENOUS

## 2022-07-05 MED ORDER — ASPIRIN 300 MG RE SUPP: 300.0000 mg | RECTAL | Status: DC

## 2022-07-05 MED ORDER — SODIUM CHLORIDE 0.9% FLUSH
3.0000 mL | Freq: Two times a day (BID) | INTRAVENOUS | Status: DC
  Filled 2022-07-05: qty 3

## 2022-07-05 MED ORDER — SODIUM CHLORIDE 0.9 % IV BOLUS: 2000.0000 mL | Freq: Once | INTRAVENOUS | Status: DC

## 2022-07-05 MED ORDER — TENECTEPLASE 50 MG IV KIT: 50.0000 mg | PACK | Freq: Once | INTRAVENOUS | Status: AC

## 2022-07-05 MED ORDER — TENECTEPLASE 50 MG IV KIT
PACK | INTRAVENOUS | Status: AC
  Administered 2022-07-05: 50 mg via INTRAVENOUS
  Filled 2022-07-05: qty 10

## 2022-07-05 MED ORDER — SODIUM CHLORIDE 0.9 % IV BOLUS
1000.0000 mL | Freq: Once | INTRAVENOUS | Status: AC
  Administered 2022-07-05: 1000 mL via INTRAVENOUS

## 2022-07-05 MED ORDER — SODIUM CHLORIDE 0.9% FLUSH
3.0000 mL | INTRAVENOUS | Status: DC | PRN
  Filled 2022-07-05: qty 3

## 2022-07-05 MED ORDER — NOREPINEPHRINE 4 MG/250ML-% IV SOLN
INTRAVENOUS | Status: AC
  Filled 2022-07-05: qty 250

## 2022-07-05 MED ORDER — NOREPINEPHRINE 4 MG/250ML-% IV SOLN
0.0000 ug/min | INTRAVENOUS | Status: DC
  Administered 2022-07-05: 10 ug/min via INTRAVENOUS

## 2022-07-06 LAB — CBG MONITORING, ED: Glucose-Capillary: 211 mg/dL — ABNORMAL HIGH (ref 70–99)

## 2022-07-30 NOTE — Progress Notes (Incomplete)
Patient gray in color upon entering room, and unable to initially obtain a SAT. Placed on 100% NRB. Patient's color improved

## 2022-07-30 NOTE — ED Notes (Signed)
[facility] at bedside upon arrival back to room from [place]. Pt continued to be pale and diaphoretic, both IVs became dislodged upon transfer to [facility] stretcher despite them being secured with tape and coband as well as despite multiple nurses continued assessment of IV patency, The patient's BP decreased to 17G systolic. Pt reported severe chest pain. Decision made to take the patient to room 14 to prepare for intubation. New IV established immediately. During this the patient became unresponsive. CPR started. Dr. Langston Masker at bedside during this.

## 2022-07-30 NOTE — ED Notes (Signed)
[facility] at bedside with wife

## 2022-07-30 NOTE — Code Documentation (Addendum)
Dr Langston Masker at bedside with [place] of heart. No cardiac activity, time of death 1705pm.

## 2022-07-30 NOTE — ED Notes (Signed)
Dr Langston Masker at bedside, [place] of heart. No cardiac movement. Chest compressions resumed

## 2022-07-30 NOTE — ED Notes (Signed)
Wife at the bedside

## 2022-07-30 NOTE — ED Notes (Signed)
Wife remains at bedside.

## 2022-07-30 NOTE — Progress Notes (Signed)
RT assisted with intubation. 7.5 ETT secured at 24 at the lip. Positive color change with ETCO2 and bilateral breath sounds. Patient bagged throughout code. Pink frothy secrtions suctioned from ETT numerous times. ETT left in place after time of death declared. 

## 2022-07-30 NOTE — ED Notes (Signed)
50mg  TNK given

## 2022-07-30 NOTE — ED Notes (Signed)
[facility] at bedside  EDP made aware of BP 59/49

## 2022-07-30 NOTE — ED Notes (Addendum)
Pt placed on lucas

## 2022-07-30 NOTE — ED Notes (Addendum)
Error in charting.

## 2022-07-30 NOTE — Code Documentation (Addendum)
Patient time of death occurred at 74.

## 2022-07-30 NOTE — ED Notes (Signed)
RT assisted with intubation. 7.5 ETT secured at 24 at the lip. Positive color change with ETCO2 and bilateral breath sounds. Patient bagged throughout code. Pink frothy secrtions suctioned from ETT numerous times. ETT left in place after time of death declared.

## 2022-07-30 NOTE — Code Documentation (Signed)
Family at beside. Family given emotional support. 

## 2022-07-30 NOTE — ED Notes (Signed)
Patient transported to CT on cardiac monitor with RN.

## 2022-07-30 NOTE — Progress Notes (Signed)
ANTICOAGULATION CONSULT NOTE - Initial Consult  Pharmacy Consult for Heparin Indication: chest pain/ACS  No Known Allergies  Patient Measurements: Height: 6' (182.9 cm) Weight: 102.9 kg (226 lb 13.7 oz) IBW/kg (Calculated) : 77.6 Heparin Dosing Weight: 98.8 kg  Vital Signs: Temp: 94.7 F (34.8 C) 07-27-22 1613) Temp Source: Tympanic 07-27-22 1613) BP: 83/54 Jul 27, 2022 1608) Pulse Rate: 76 07/27/2022 1608)  Labs: Recent Labs    2022/07/27 1605 2022/07/27 1609 Jul 27, 2022 1615  HGB  --  14.3  --   HCT  --  42.0  --   APTT  --   --  23*  LABPROT 13.4  --   --   INR 1.0  --   --   CREATININE 1.15  --   --     Estimated Creatinine Clearance: 71 mL/min (by C-G formula based on SCr of 1.15 mg/dL).   Medical History: Past Medical History:  Diagnosis Date   Arthritis    Closed intertrochanteric fracture of left hip ([place]) 07/20/2011   Gout    Left patella fracture 07/19/2011   S/P gastric bypass 07/19/2011    Medications:  (Not in a hospital admission)  Scheduled:   heparin  4,000 Units Intravenous Once   Infusions:   sodium chloride     heparin     heparin     norepinephrine     norepinephrine (LEVOPHED) Adult infusion     sodium chloride     PRN: heparin, norepinephrine  Assessment: 71 yom presenting with collapse and shortness of breath. Code STEMI activated at [facility]. Heparin per pharmacy consult placed for chest pain/ACS.  Patient is not on anticoagulation prior to arrival.  Hgb 14.3 aPTT 23 PT/INR 13.4/1  Goal of Therapy:  Heparin level 0.3-0.7 units/ml Monitor platelets by anticoagulation protocol: Yes   Plan:  Give IV heparin 4000 units bolus x 1 Start heparin infusion at 1300 units/hr Check anti-Xa level in 6 hours and daily while on heparin Continue to monitor H&H and platelets  Lorelei Pont, PharmD, BCPS 2022/07/27 4:33 PM ED Clinical Pharmacist -  [phone]

## 2022-07-30 NOTE — ED Notes (Addendum)
Water / snacks and recliner for family's comfort

## 2022-07-30 NOTE — ED Notes (Addendum)
Called [facility] spoke with Sherita to inform them patient expired. Was told we'd be contacted back.

## 2022-07-30 NOTE — ED Notes (Addendum)
Levo increased to max 63mcg/min

## 2022-07-30 NOTE — ED Triage Notes (Signed)
Pt brought by his wife POV who states they were at a wedding when he became very dizzy, pale, diaphoretic. Upon arrival he was reporting chest pain, sob, appeared pale, diaphoretic and lethargic. He is able to answer questions appropriately.   Pt unable to get himself out of his car. Pt was assisted into a wheelchair and brought directly to room 6 upon arrival. Initial O2 sats 83% RA and BP 73/62  Pt placed on zoll upon arrival, dr. Langston Masker at bedside.

## 2022-07-30 NOTE — ED Provider Notes (Signed)
[facility] EMERGENCY DEPARTMENT Provider Note   CSN: [id number] Arrival date & time: 2022/07/30  1548     History  Chief Complaint  Patient presents with   Shortness of Breath   Chest Pain    [name]. is a 72 y.o. male present emergency department with collapse and shortness of breath.  History is provided by the patient's wife, reports the patient been complaining of "bad indigestion" all week.  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  His wife reports they were not exerting himself at the time.  The patient himself arrives into the ED, diaphoretic, able to provide minimal history, telling me he does not have a headache, he does have chest discomfort.  He feels short of breath.  His wife reports the patient does not have any history of smoking, coronary disease, MI, DVT or PE.  He is not on anticoagulation.  He had a history of diabetes in the past that was controlled with diet, no longer on diabetic medicines.  His blood pressure is "normally good".  HPI     Home Medications Prior to Admission medications   Medication Sig Start Date End Date Taking? Authorizing Provider  gabapentin (NEURONTIN) 300 MG capsule Take 600 mg by mouth 2 (two) times daily.    [provider]  HYDROcodone-acetaminophen (NORCO/VICODIN) 5-325 MG tablet Take 1 tablet by mouth every 6 (six) hours as needed for severe pain. 12/07/16   [name], [name], PA-C  predniSONE (DELTASONE) 10 MG tablet Take 10 mg by mouth daily. 02/04/16   [provider]  traMADol (ULTRAM) 50 MG tablet Take 1 tablet (50 mg total) by mouth every 6 (six) hours as needed. For pain 07/05/13   [name], DPM      Allergies    Patient has no known allergies.    Review of Systems   Review of Systems  Physical Exam Updated Vital Signs BP (!) 83/54 (BP Location: Left Arm)   Pulse  76   Temp (!) 94.7 F (34.8 C) (Tympanic)   Resp (!) 24   Ht 6' (1.829 m)   Wt 102.9 kg   SpO2 100%   BMI 30.77 kg/m  Physical Exam Constitutional:      General: He is not in acute distress.    Appearance: He is ill-appearing.  HENT:     Head: Normocephalic and atraumatic.  Eyes:     Conjunctiva/sclera: Conjunctivae normal.     Pupils: Pupils are equal, round, and reactive to light.  Cardiovascular:     Rate and Rhythm: Normal rate and regular rhythm.  Pulmonary:     Effort: Pulmonary effort is normal. No respiratory distress.     Comments: 80% on room air, 95% on Roy Abdominal:     General: There is no distension.     Tenderness: There is no abdominal tenderness.  Skin:    General: Skin is warm and dry.     Comments: Cold extremities  Neurological:     General: No focal deficit present.     Mental Status: He is alert. Mental status is at baseline.  Psychiatric:        Mood and Affect: Mood normal.        Behavior: Behavior normal.     ED Results / Procedures / Treatments   Labs (all labs ordered are listed, but only abnormal results  are displayed) Labs Reviewed  COMPREHENSIVE METABOLIC PANEL - Abnormal; Notable for the following components:      Result Value   Sodium 134 (*)    CO2 18 (*)    Glucose, Bld 278 (*)    Calcium 8.8 (*)    AST 49 (*)    All other components within normal limits  APTT - Abnormal; Notable for the following components:   aPTT 23 (*)    All other components within normal limits  I-STAT VENOUS BLOOD GAS, ED - Abnormal; Notable for the following components:   pH, Ven 7.222 (*)    pCO2, Ven 43.4 (*)    Bicarbonate 17.9 (*)    TCO2 19 (*)    Acid-base deficit 10.0 (*)    All other components within normal limits  TROPONIN I (HIGH SENSITIVITY) - Abnormal; Notable for the following components:   Troponin I (High Sensitivity) 353 (*)    All other components within normal limits  RESP PANEL BY RT-PCR (RSV, FLU A&B, COVID)  RVPGX2   PROTIME-INR  CBC WITH DIFFERENTIAL/PLATELET  BLOOD GAS, VENOUS  LACTIC ACID, PLASMA  LACTIC ACID, PLASMA  I-STAT CHEM 8, ED    EKG None  Radiology CT Angio Chest PE W and/or Wo Contrast  Result Date: 22-Jul-2022 CLINICAL DATA:  Dizziness, chest pain, shortness of breath, diaphoresis EXAM: CT ANGIOGRAPHY CHEST WITH CONTRAST TECHNIQUE: Multidetector CT imaging of the chest was performed using the standard protocol during bolus administration of intravenous contrast. Multiplanar CT image reconstructions and MIPs were obtained to evaluate the vascular anatomy. RADIATION DOSE REDUCTION: This exam was performed according to the departmental dose-optimization program which includes automated exposure control, adjustment of the mA and/or kV according to patient size and/or use of iterative reconstruction technique. CONTRAST:  OMNIPAQUE IOHEXOL 350 MG/ML SOLN COMPARISON:  Chest radiographs done on 12/08/2015 FINDINGS: Cardiovascular: Motion artifacts limit evaluation. As far as seen, there is no evidence of any large filling defect in the central pulmonary artery branches. Low-density foci seen in the main pulmonary arteries appear to extend outside the margin of the vessels, possibly artifacts. Evaluation of peripheral pulmonary artery branches is limited by motion artifacts and diffuse alveolar densities in both lungs. Extensive coronary artery calcifications are seen. There is no contrast enhancement in thoracic aorta. Possibility of aortic dissection is not evaluated in this study. Mediastinum/Nodes: Moderate to large fixed hiatal hernia is seen. No significant lymphadenopathy is seen. Lungs/Pleura: Extensive alveolar and ground-glass densities are seen in both lungs. There is no focal consolidation. There is no pleural effusion or pneumothorax. Upper Abdomen: Surgical clips are seen in gallbladder fossa and adjacent to the stomach. Stomach is smaller than usual in size. Musculoskeletal: No acute  findings are seen. There is previous left shoulder arthroplasty. There are small pockets of air in right upper chest and right shoulder, possibly air introduced from trauma or venipuncture. Review of the MIP images confirms the above findings. IMPRESSION: Significant motion artifacts limit evaluation. There is no evidence of large central saddle embolus. Peripheral pulmonary artery branches could not be adequately evaluated. Extensive coronary artery calcifications are seen. Extensive ground-glass and alveolar densities are noted in both lungs suggesting pulmonary edema or extensive multifocal pneumonia. There is no pleural effusion or pneumothorax. Moderate to large fixed hiatal hernia. Other findings as described in the body of the report. Electronically Signed   By: Ernie Avena M.D.   On: July 22, 2022 16:52   CT Head Wo Contrast  Result Date: Jul 22, 2022 CLINICAL DATA:  Mental status change of unknown etiology. EXAM: CT HEAD WITHOUT CONTRAST TECHNIQUE: Contiguous axial images were obtained from the base of the skull through the vertex without intravenous contrast. RADIATION DOSE REDUCTION: This exam was performed according to the departmental dose-optimization program which includes automated exposure control, adjustment of the mA and/or kV according to patient size and/or use of iterative reconstruction technique. COMPARISON:  12/07/2016 FINDINGS: Brain: No evidence of acute infarction, hemorrhage, hydrocephalus, extra-axial collection or mass lesion/mass effect. Vascular: No hyperdense vessel or unexpected calcification. Skull: Normal. Negative for fracture or focal lesion. Sinuses/Orbits: No acute finding. Other: None. IMPRESSION: No acute intracranial pathology. Electronically Signed   By: Kerby Moors M.D.   On: July 21, 2022 16:43    Procedures .Critical Care  Performed by: Wyvonnia Dusky, MD Authorized by: Wyvonnia Dusky, MD   Critical care provider statement:    Critical care time  (minutes):  74   Critical care time was exclusive of:  Separately billable procedures and treating other patients   Critical care was necessary to treat or prevent imminent or life-threatening deterioration of the following conditions:  Cardiac failure   Critical care was time spent personally by me on the following activities:  Ordering and performing treatments and interventions, ordering and review of laboratory studies, ordering and review of radiographic studies, pulse oximetry, review of old charts, examination of patient and evaluation of patient's response to treatment   Care discussed with: accepting provider at another facility   Comments:     STEMI, cardiogenic shock Intubation, PEA cardiac arrest     Medications Ordered in ED Medications  norepinephrine (LEVOPHED) 4-5 MG/250ML-% infusion SOLN (has no administration in time range)  sodium chloride 0.9 % bolus 2,000 mL (has no administration in time range)  0.9 %  sodium chloride infusion (has no administration in time range)  norepinephrine (LEVOPHED) 4mg  in 281mL (0.016 mg/mL) premix infusion (has no administration in time range)  heparin 25000 UT/250ML infusion (has no administration in time range)  rocuronium bromide 100 MG/10ML SOSY (has no administration in time range)  etomidate (AMIDATE) 2 MG/ML injection (has no administration in time range)  0.9 %  sodium chloride infusion (has no administration in time range)  sodium chloride flush (NS) 0.9 % injection 3 mL (has no administration in time range)  sodium chloride flush (NS) 0.9 % injection 3 mL (has no administration in time range)  0.9 %  sodium chloride infusion (has no administration in time range)  aspirin chewable tablet 324 mg (has no administration in time range)    Or  aspirin suppository 300 mg (has no administration in time range)  sodium chloride 0.9 % bolus 1,000 mL (0 mLs Intravenous Stopped 07/21/22 1607)  iohexol (OMNIPAQUE) 350 MG/ML injection 100 mL (100  mLs Intravenous Contrast Given 2022/07/21 1630)  EPINEPHrine (ADRENALIN) 1 MG/10ML injection (1 mg Intravenous Given 2022-07-21 1703)  etomidate (AMIDATE) injection (30 mg Intravenous Given 21-Jul-2022 1637)  rocuronium (ZEMURON) injection (70 mg Intravenous Given 21-Jul-2022 1637)  EPINEPHrine (ADRENALIN) 1 MG/10ML injection (1 mg Intravenous Given 2022-07-21 1701)  tenecteplase (TNKASE) injection for PE/MI 50 mg (50 mg Intravenous Given July 21, 2022 1647)  sodium bicarbonate injection (50 mEq Intravenous Given July 21, 2022 1657)    ED Course/ Medical Decision Making/ A&P Clinical Course as of Jul 21, 2022 1724  Sun 2022/07/21 I spoke to Dr Ali Lowe, STEMI doctor, who agrees with CODE STEMI.  [facility] has truck en route for transfer to [facility].  Patient's wife at bedside updated.  Patient finishing CT scan now, started on levophed for hypotension.  Holding nitro SL for hypotension. [MT]  1659 Patient in cardiac arrest, PEA arrest, intubated, now with code ongoing s/p TNK given (see MAR for timing) [MT]  1712 Time of death 5:05 pm.  Patient's wife at bedside. [MT]  1714 I spoke to ME - case declined. [MT]    Clinical Course User Index [MT] Rasheem Figiel, Kermit Balo, MD                           Medical Decision Making Amount and/or Complexity of Data Reviewed Labs: ordered. Radiology: ordered. ECG/medicine tests: ordered.  Risk OTC drugs. Prescription drug management.   This patient presents to the ED with concern for hypotension with diaphoresis, hypoxia, collapse. This involves an extensive number of treatment options, and is a complaint that carries with it a high risk of complications and morbidity.  The differential diagnosis includes pulmonary embolism versus ACS versus cardiogenic shock versus other  Co-morbidities that complicate the patient evaluation: History of diabetes, age, risk factors for cardiovascular disease  Additional history obtained from the patient's wife at bedside  External records from outside  source obtained and reviewed including prior ecg Aug 2017 showing NSR  I ordered and personally interpreted labs.  The pertinent results include: Potassium within normal limits.  Mild respiratory acidosis.  Creatinine within normal limits.  Troponin 353.  I ordered imaging studies including CT head, CT PE I independently visualized and interpreted imaging which showed motion degraded CT scan, but no evident pulmonary embolism, pulmonary edema noted on CT. I agree with the radiologist interpretation  The patient was maintained on a cardiac monitor.  I personally viewed and interpreted the cardiac monitored which showed an underlying rhythm of: Rate controlled sinus rhythm, occasional A-fib  Per my interpretation the patient's ECG shows sinus rhythm with some motion artifact, ST depression in V2, concerning for potential posterior MI or infarct  I ordered medication including IV fluids and Levophed for hypotension.  IV heparin for suspected acute coronary syndrome.  Test Considered: With his clinical presentation I had a lower suspicion immediately for acute aortic dissection.  I requested consultation with the cardiology,  and discussed lab and imaging findings as well as pertinent plan - they recommend: See ED course.  In short activation of code STEMI and stat transfer to [facility].  After the interventions noted above, I reevaluated the patient and found that they have: improved -blood pressure improved on peripheral Levophed.  At the time of transfer the patient was maintaining his vital signs and oxygenation levels adequately on supplemental oxygen.  He had a GCS of 15.  There is no immediate indication for intubation.  I also felt it was best to avoid intubation given his degree of hypotension, as this may acutely worsen his blood pressure.   Dispostion:  After consideration of the diagnostic results and the patients response to treatment, I feel that the patent would benefit from  transfer to [facility] for CODE STEMI likely cath lab.  Accepting Dr Jonette Eva, cardiology   *  Update  -after discussion with cardiology, and at the time of emergent transfer, the patient decompensated, with worsening respiratory distress and went into suspected cardiogenic shock.  He was found to be in PEA cardiac arrest.  Compressions were immediately started.  The patient was intubated with RSI medications rocuronium and etomidate.  Frothy pink fluid was noted to be in the intubation tube  consistent with significant pulmonary edema, which I suspect, is secondary to cardiogenic shock.  The patient was run through PEA arrest code with multiple rounds of epinephrine given, sodium bicarb, and fluids.  On repeat pulse check to remain in PEA arrest, bedside echocardiogram showed cardiac standstill.  The patient was then given thrombolytics with concern for possible coronary occlusion.  Compressions in the code were continued afterwards.  At 5:05 PM, on repeat pulse check he was in PEA arrest with no cardiac activity on ultrasound.  The patient was declared deceased.  His wife is present at the bedside for the declaration.  I notified Dr Jonette Eva of patient's passing.        Final Clinical Impression(s) / ED Diagnoses Final diagnoses:  Chest pain, unspecified type  ST elevation myocardial infarction (STEMI), unspecified artery (HCC)  Cardiac arrest [facility])    Rx / DC Orders ED Discharge Orders     None         Terald Sleeper, MD July 25, 2022 1724

## 2022-07-30 NOTE — ED Notes (Signed)
CBG 211  Override no arm band

## 2022-07-30 NOTE — ED Notes (Signed)
ED Provider at bedside. 

## 2022-07-30 NOTE — ED Notes (Signed)
LEVophed infusing 25 mcg/kg/min, edp trifan aware.

## 2022-07-30 DEATH — deceased
